# Patient Record
Sex: Female | Born: 1980 | Race: White | Hispanic: No | Marital: Single | Smoking: Never smoker
Health system: Southern US, Community
[De-identification: ages and names within clinical notes are randomized; demographics above are authoritative.]

## PROBLEM LIST (undated history)

## (undated) ENCOUNTER — Ambulatory Visit: Payer: BC Managed Care – PPO | Source: Home / Self Care

## (undated) DIAGNOSIS — H6690 Otitis media, unspecified, unspecified ear: Secondary | ICD-10-CM

## (undated) HISTORY — DX: Otitis media, unspecified, unspecified ear: H66.90

## (undated) HISTORY — PX: MYRINGOTOMY: SUR874

---

## 2005-07-10 ENCOUNTER — Emergency Department (HOSPITAL_COMMUNITY): Admission: EM | Admit: 2005-07-10 | Discharge: 2005-07-11 | Payer: Self-pay | Admitting: Emergency Medicine

## 2005-09-27 ENCOUNTER — Emergency Department (HOSPITAL_COMMUNITY): Admission: EM | Admit: 2005-09-27 | Discharge: 2005-09-27 | Payer: Self-pay | Admitting: Family Medicine

## 2006-05-29 ENCOUNTER — Emergency Department (HOSPITAL_COMMUNITY): Admission: EM | Admit: 2006-05-29 | Discharge: 2006-05-29 | Payer: Self-pay | Admitting: Emergency Medicine

## 2007-01-18 ENCOUNTER — Emergency Department (HOSPITAL_COMMUNITY): Admission: EM | Admit: 2007-01-18 | Discharge: 2007-01-18 | Payer: Self-pay | Admitting: Emergency Medicine

## 2007-02-12 ENCOUNTER — Inpatient Hospital Stay (HOSPITAL_COMMUNITY): Admission: AD | Admit: 2007-02-12 | Discharge: 2007-02-12 | Payer: Self-pay | Admitting: Obstetrics and Gynecology

## 2007-05-29 ENCOUNTER — Inpatient Hospital Stay (HOSPITAL_COMMUNITY): Admission: AD | Admit: 2007-05-29 | Discharge: 2007-05-29 | Payer: Self-pay | Admitting: Obstetrics and Gynecology

## 2007-07-01 ENCOUNTER — Inpatient Hospital Stay (HOSPITAL_COMMUNITY): Admission: AD | Admit: 2007-07-01 | Discharge: 2007-07-04 | Payer: Self-pay | Admitting: Obstetrics and Gynecology

## 2007-07-01 ENCOUNTER — Encounter (INDEPENDENT_AMBULATORY_CARE_PROVIDER_SITE_OTHER): Payer: Self-pay | Admitting: Obstetrics and Gynecology

## 2007-08-15 ENCOUNTER — Emergency Department (HOSPITAL_COMMUNITY): Admission: EM | Admit: 2007-08-15 | Discharge: 2007-08-15 | Payer: Self-pay | Admitting: Emergency Medicine

## 2008-05-13 ENCOUNTER — Emergency Department (HOSPITAL_COMMUNITY): Admission: EM | Admit: 2008-05-13 | Discharge: 2008-05-13 | Payer: Self-pay | Admitting: Emergency Medicine

## 2008-05-27 ENCOUNTER — Inpatient Hospital Stay (HOSPITAL_COMMUNITY): Admission: AD | Admit: 2008-05-27 | Discharge: 2008-05-27 | Payer: Self-pay | Admitting: Obstetrics & Gynecology

## 2008-06-21 ENCOUNTER — Inpatient Hospital Stay (HOSPITAL_COMMUNITY): Admission: AD | Admit: 2008-06-21 | Discharge: 2008-06-21 | Payer: Self-pay | Admitting: Obstetrics & Gynecology

## 2008-11-14 ENCOUNTER — Inpatient Hospital Stay (HOSPITAL_COMMUNITY): Admission: AD | Admit: 2008-11-14 | Discharge: 2008-11-14 | Payer: Self-pay | Admitting: Obstetrics & Gynecology

## 2008-12-19 ENCOUNTER — Inpatient Hospital Stay (HOSPITAL_COMMUNITY): Admission: RE | Admit: 2008-12-19 | Discharge: 2008-12-22 | Payer: Self-pay | Admitting: Obstetrics and Gynecology

## 2009-01-07 IMAGING — US US ABDOMEN COMPLETE
1 series · 14 of 25 positions shown · non-contrast
Comparison: none

CLINICAL DATA: Abdominal pain. 
ABDOMEN ULTRASOUND COMPLETE ? 08/15/07:
TECHNIQUE: Complete abdominal ultrasound examination was performed including evaluation of the liver, gallbladder, bile ducts, pancreas, kidneys, spleen, IVC, and abdominal aorta.

[Series 1: unknown · 0.30mm/px · 14 of 50 slices shown]
[im 1/50]
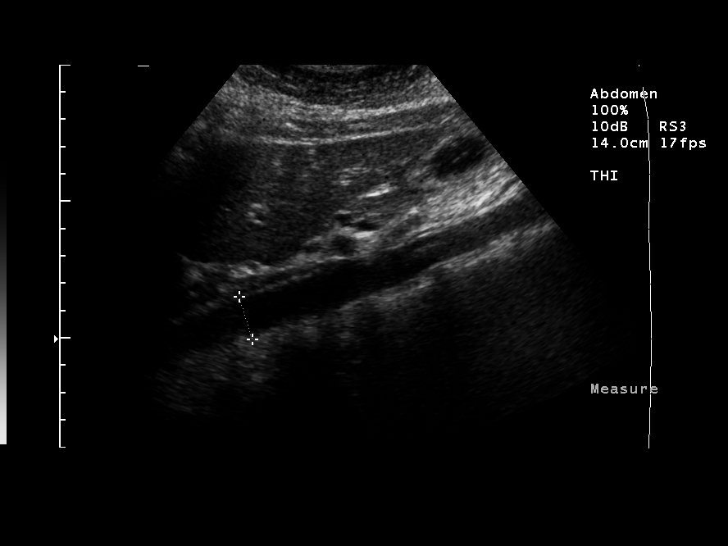
[im 5/50]
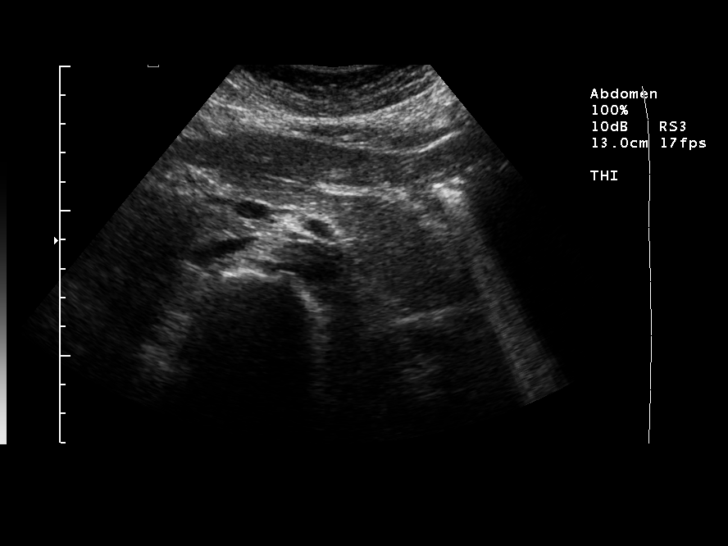
[im 9/50]
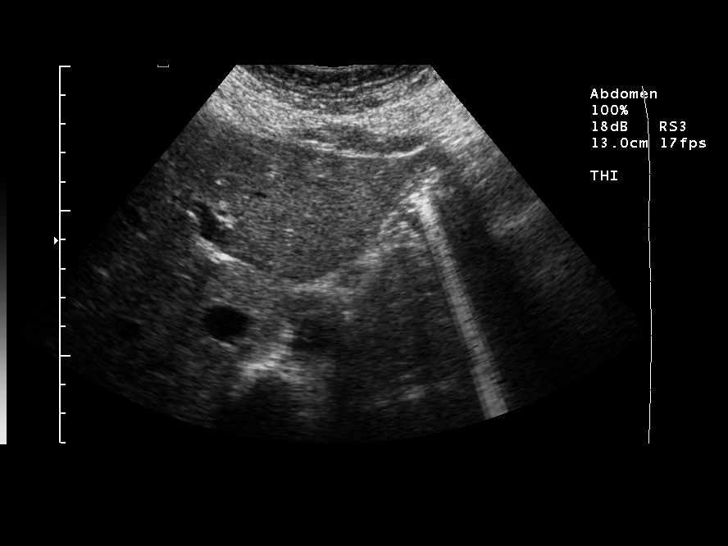
[im 13/50]
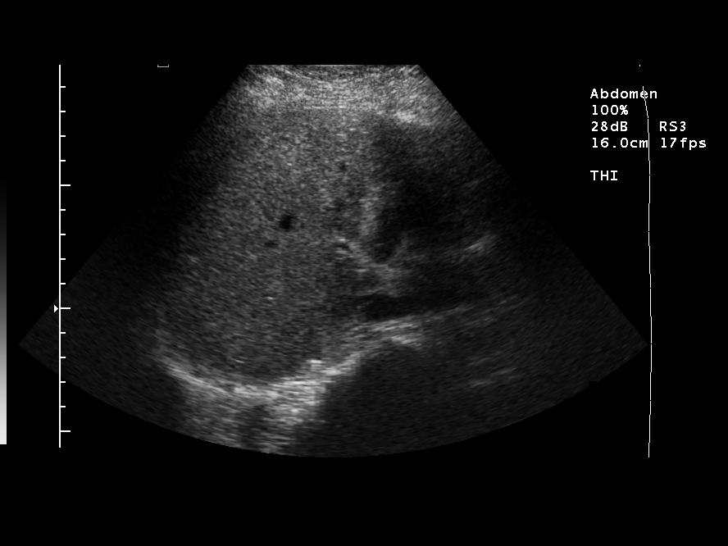
[im 17/50]
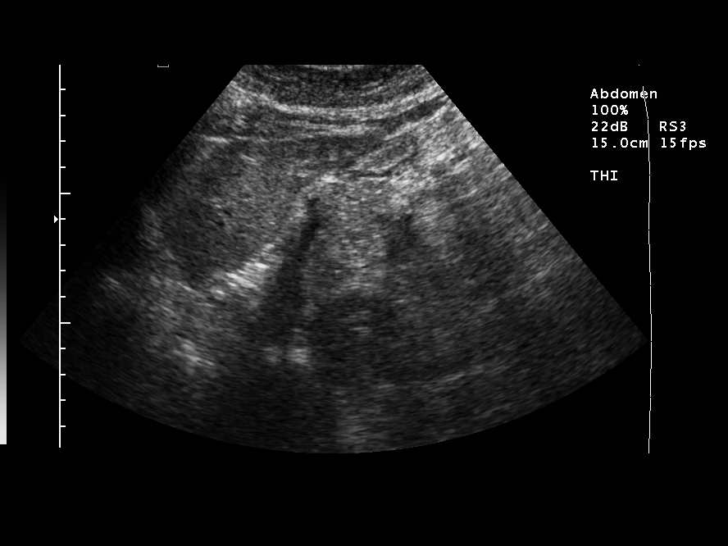
[im 19/50]
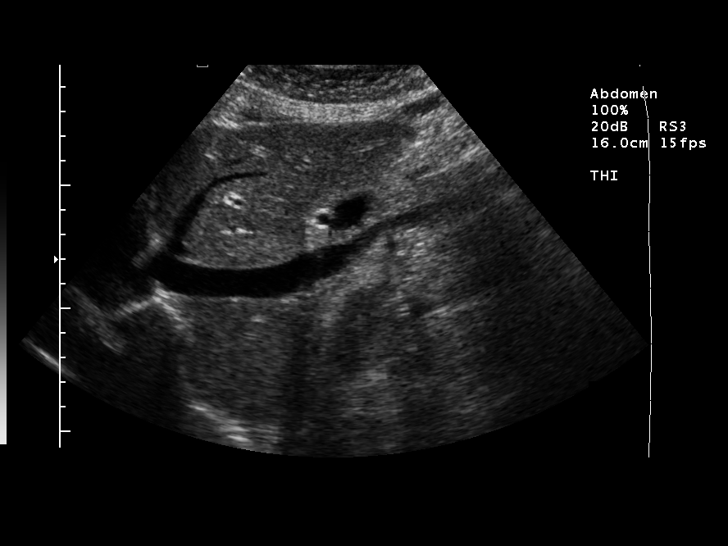
[im 23/50]
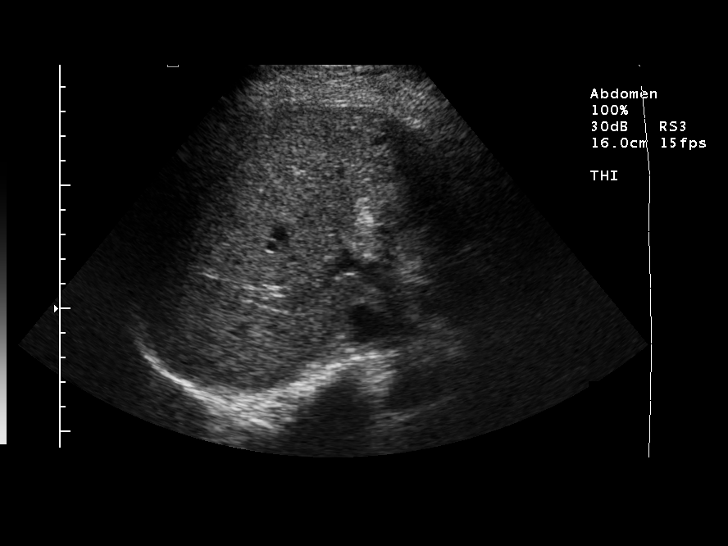
[im 27/50]
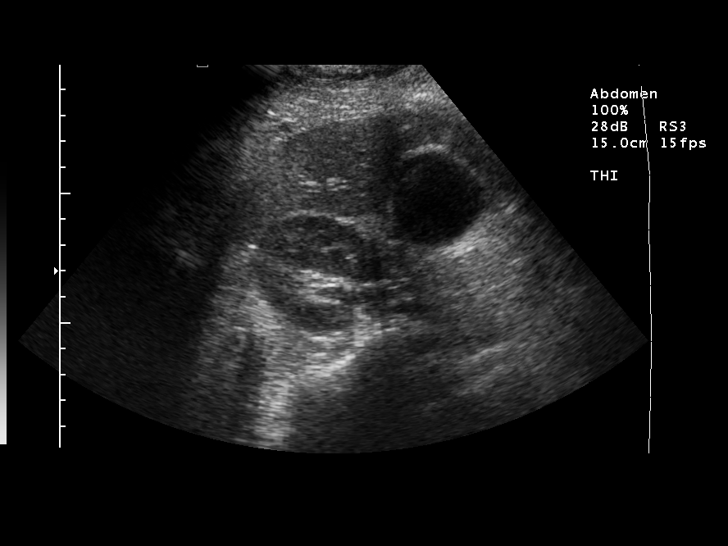
[im 31/50]
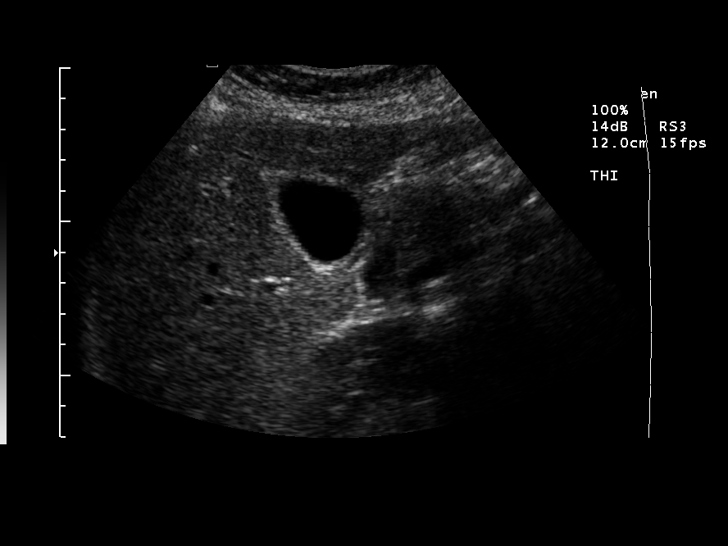
[im 33/50]
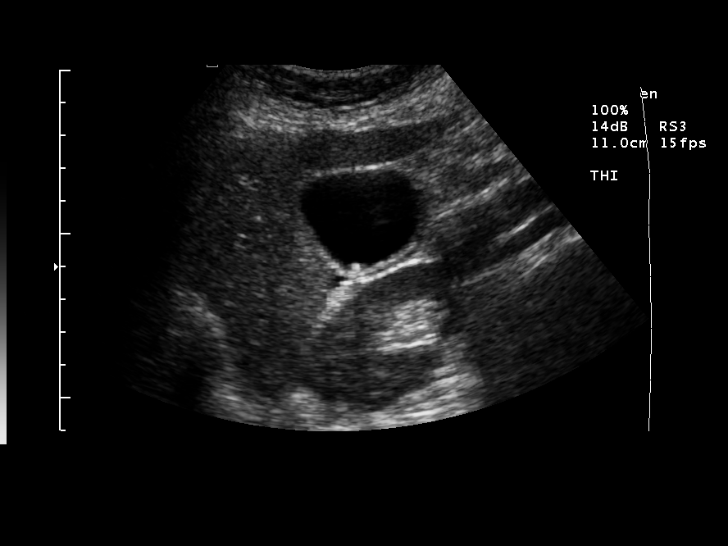
[im 37/50]
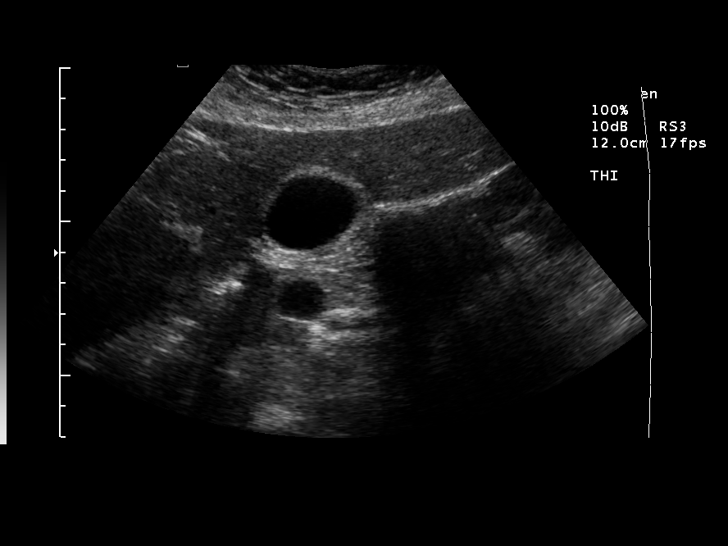
[im 41/50]
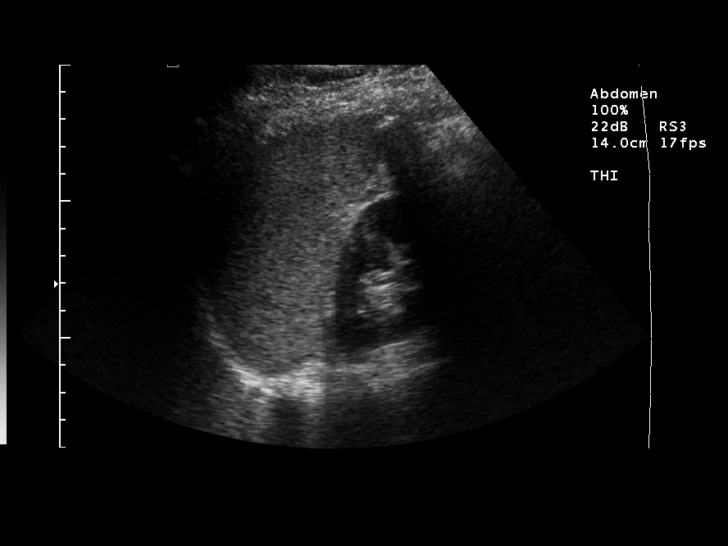
[im 45/50]
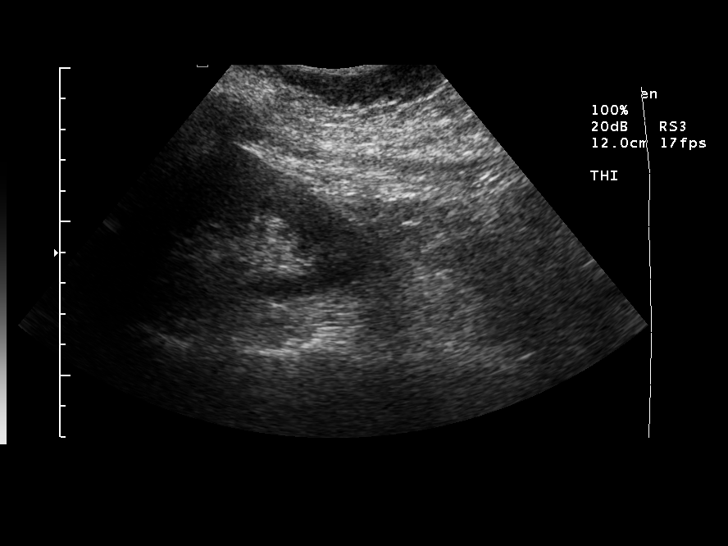
[im 50/50]
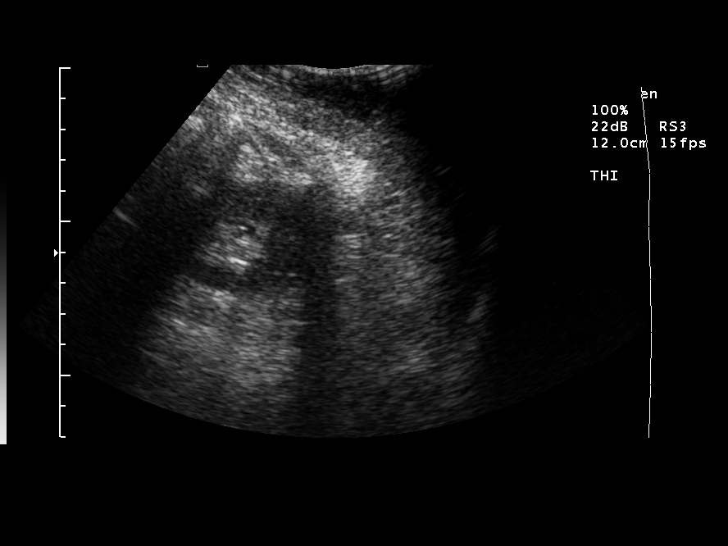

[14 of 25 positions shown; findings below may reference images not displayed]

FINDINGS: Gallstones.  Gallbladder wall thickness is 1.8 mm.  No gallbladder wall edema or free pericholecystic fluid.  The common duct measures 2.7 mm in diameter which is well within normal limits.  No hepatic, pancreatic, splenic, or renal abnormality.  The spleen measures 10.2 cm in length.  The right and left kidneys measure 10.8 cm and 10.0 cm in length, respectively.  Patent IVC.  The maximum diameter of the abdominal aorta is 1.6 cm.
IMPRESSION: Cholelithiasis.  No biliary ductal dilatation.

## 2009-10-20 IMAGING — US US OB COMP LESS 14 WK
1 series · 14 of 16 positions shown · non-contrast
Comparison: none

CLINICAL DATA: OBSTETRIC <14 WK ULTRASOUND
TECHNIQUE: Transvaginal ultrasound was performed for evaluation
of the gestation as well as the maternal uterus and adnexal
regions.

Number of gestation: 1
Heart Rate: 169 bpm
CRL:  25.7 mm         9w  2d
Maternal uterus/adnexae:
There is no evidence for subchorionic hemorrhage
The right and left ovary appear normal.
There is no free fluid.

[Series 1: us ob comp less 14 wk · 0.23mm/px · 16 acquisitions, 14 frames shown]
[im 1/16]
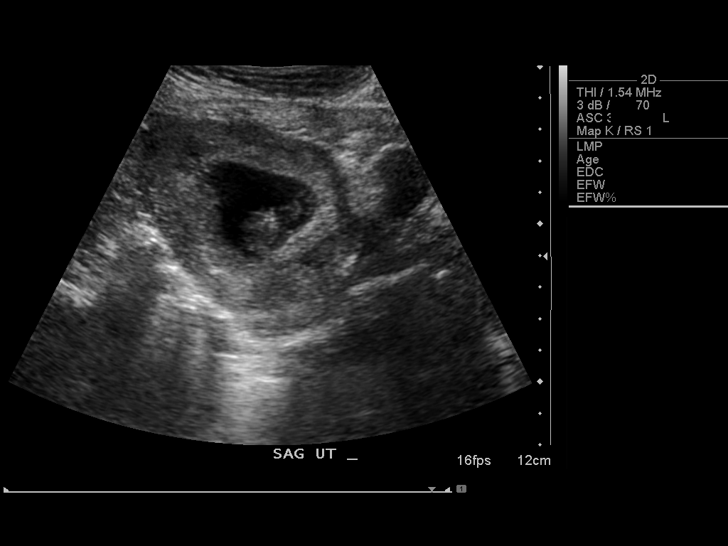
[im 2/16]
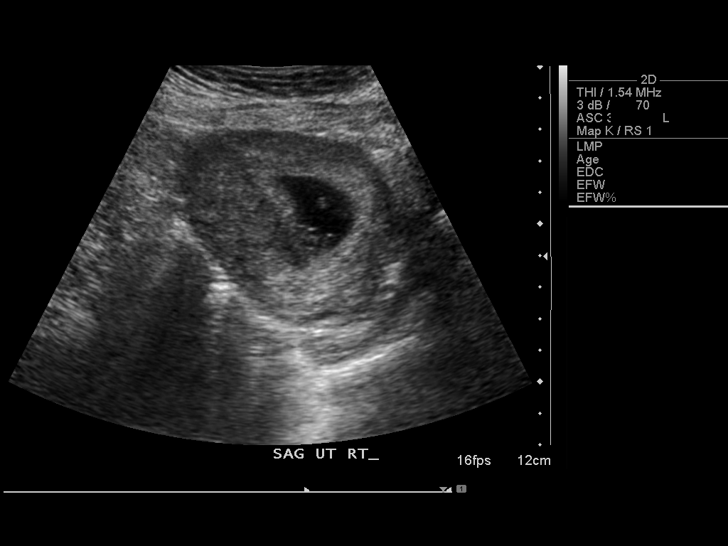
[im 3/16]
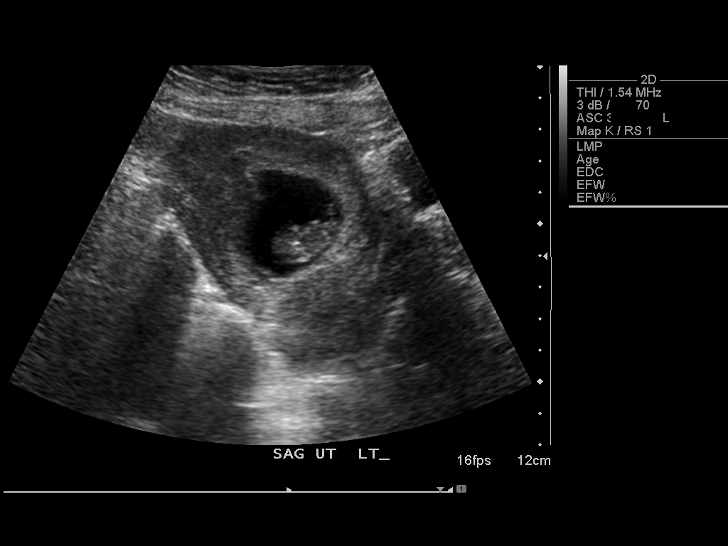
[im 5/16]
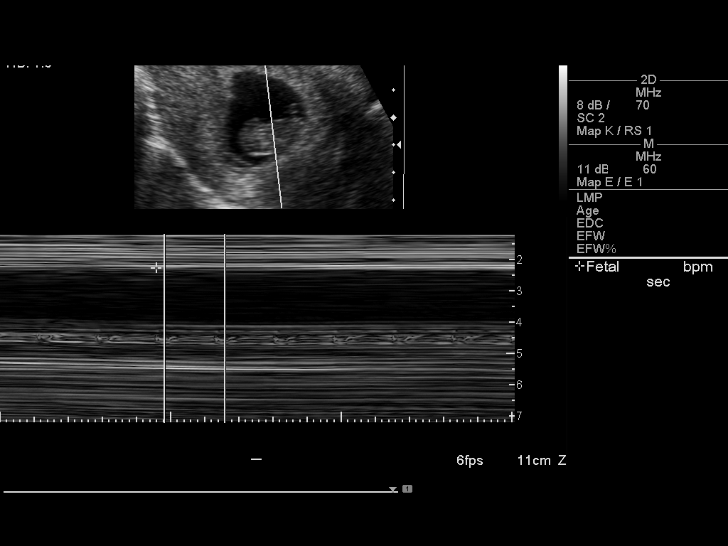
[im 6/16]
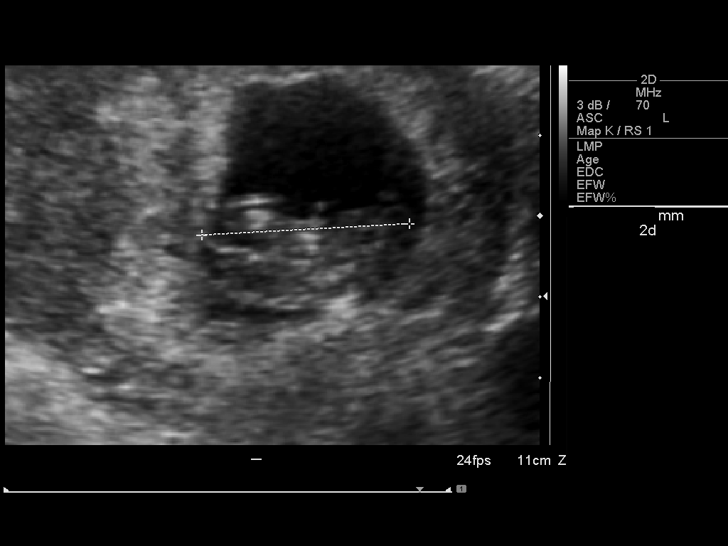
[im 7/16]
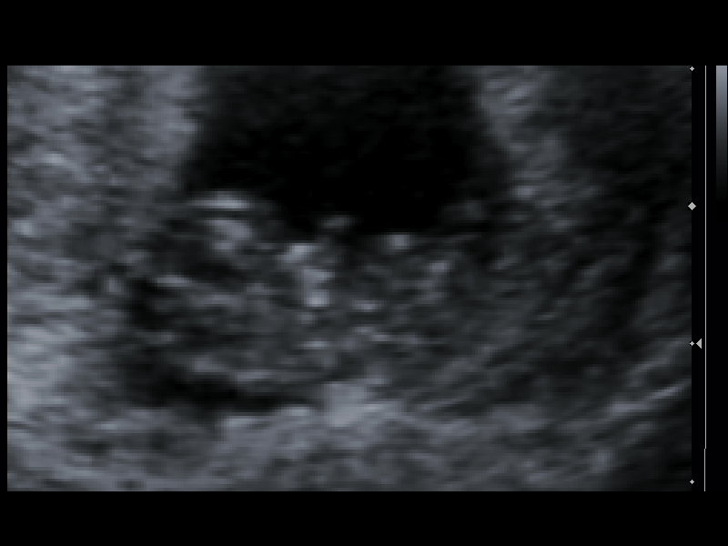
[im 8/16]
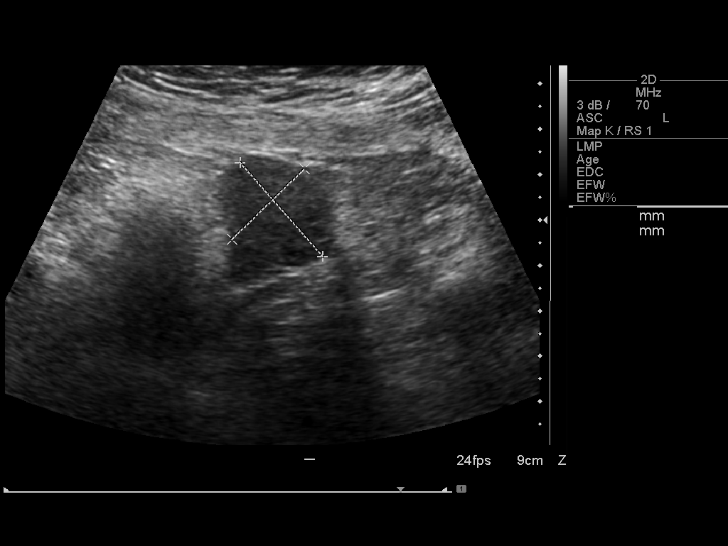
[im 9/16]
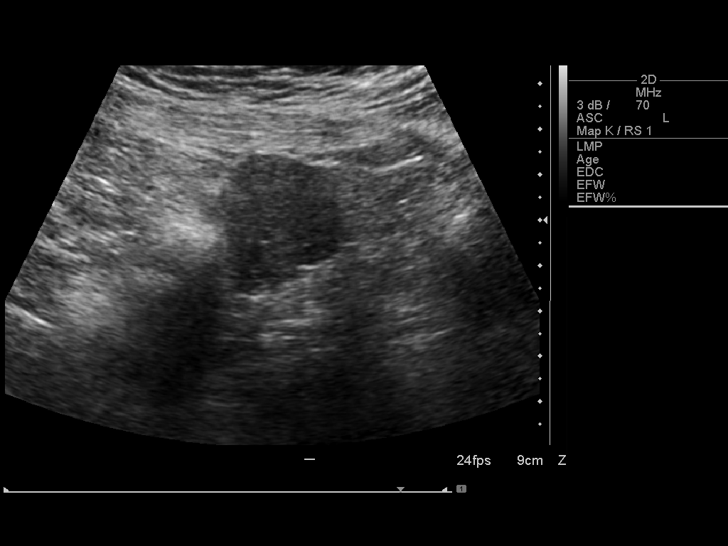
[im 10/16]
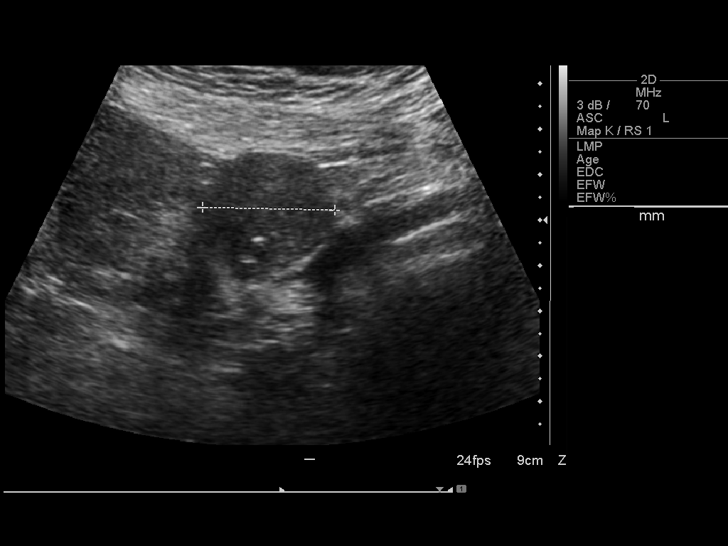
[im 11/16]
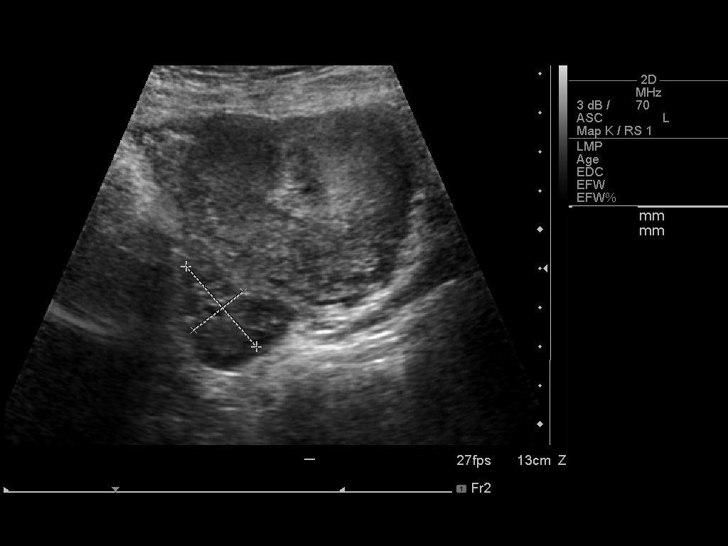
[im 13/16]
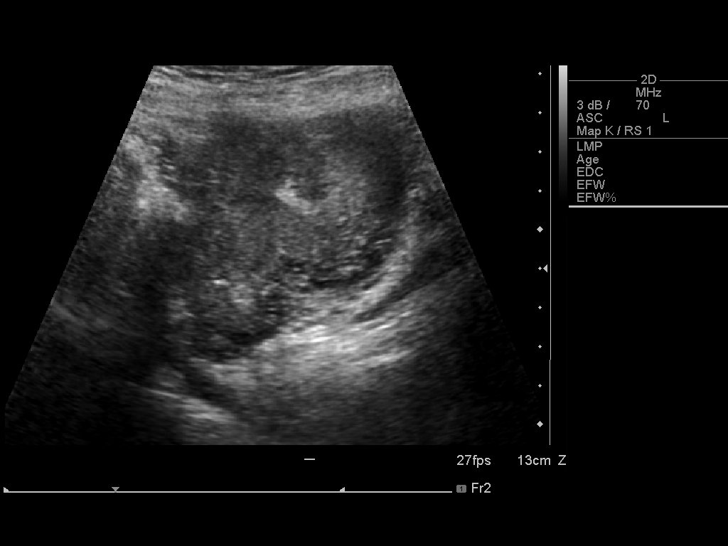
[im 14/16]
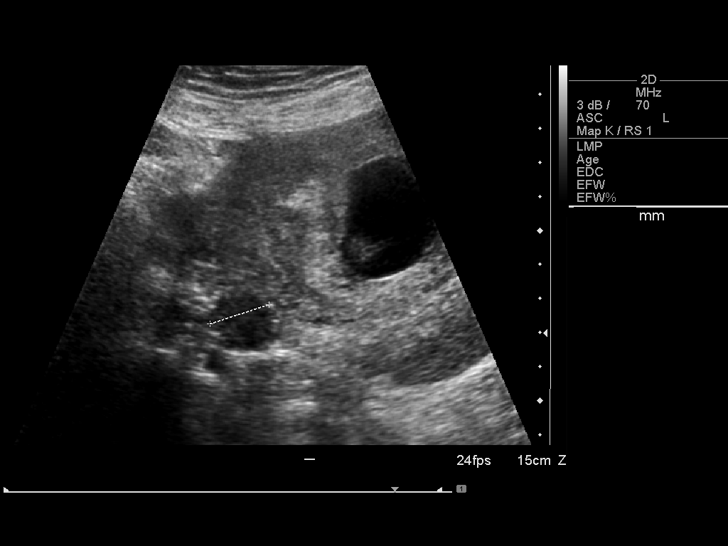
[im 15/16]
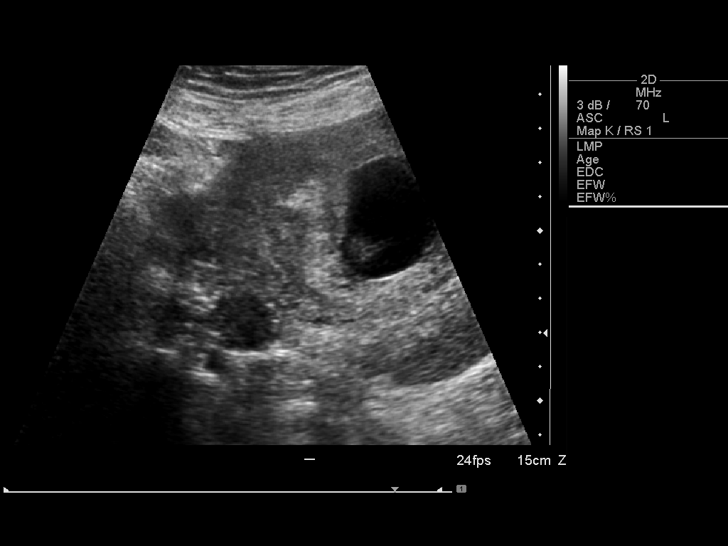
[im 16/16]
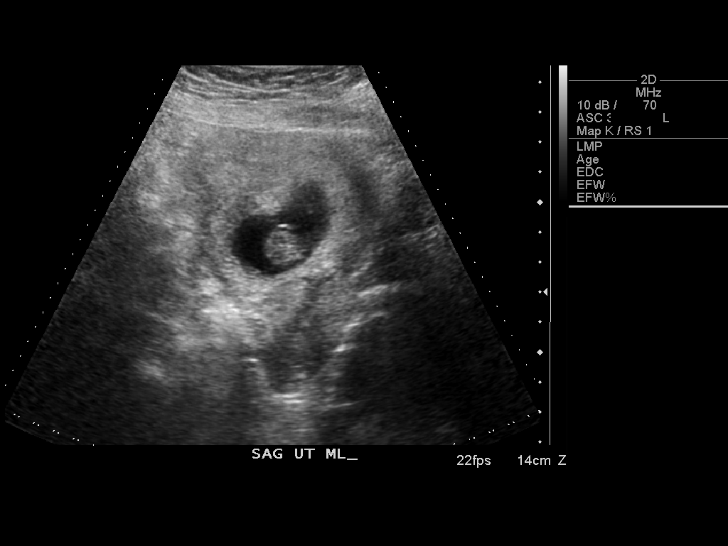

[14 of 16 positions shown; findings below may reference images not displayed]

IMPRESSION: 1.  Single living intrauterine pregnancy with an estimated
gestational age of 9 weeks and 2 days. Correlation with the
patient's last menstrual period is advised
2.  No complicating features identified.

## 2011-03-25 LAB — CBC
Hemoglobin: 9 g/dL — ABNORMAL LOW (ref 12.0–15.0)
MCHC: 32.9 g/dL (ref 30.0–36.0)
Platelets: 174 10*3/uL (ref 150–400)
Platelets: 202 10*3/uL (ref 150–400)
RDW: 18 % — ABNORMAL HIGH (ref 11.5–15.5)
WBC: 10.6 10*3/uL — ABNORMAL HIGH (ref 4.0–10.5)

## 2011-03-25 LAB — ABO/RH: ABO/RH(D): O NEG

## 2011-03-25 LAB — RH IMMUNE GLOB WKUP(>/=20WKS)(NOT WOMEN'S HOSP)

## 2011-03-25 LAB — TYPE AND SCREEN: ABO/RH(D): O NEG

## 2011-03-25 LAB — RPR: RPR Ser Ql: NONREACTIVE

## 2011-03-25 LAB — CCBB MATERNAL DONOR DRAW

## 2011-04-23 NOTE — Op Note (Signed)
Samantha Bates, Samantha Bates            ACCOUNT NO.:  [id number]   MEDICAL RECORD NO.:  [id number]          PATIENT TYPE:  INP   LOCATION:  9318                          [facility]ACILITY:  [facility]   PHYSICIAN:  Maxie Better, M.D.DATE OF BIRTH:  [birth year]   DATE OF PROCEDURE:  07/01/2007  DATE OF DISCHARGE:                               OPERATIVE REPORT   PREOPERATIVE DIAGNOSIS:  Nonreassuring fetal tracing, concealed  placental abruption.   PROCEDURES:  An emergency primary cesarean section via high transverse  uterine incision.   POSTOPERATIVE DIAGNOSIS:  Nonreassuring fetal tracing, uterine rupture.   ANESTHESIA:  Spinal.   SURGEON:  Maxie Better, M.D.   ASSISTANT:  Marlinda Mike, C.N.M.   INDICATIONS:  30 year old G1 P0 female brought by ambulance from the  office at 35-4/7 weeks due to the fetal tracing that had repetitive deep  variable decelerations and contractions every minute.  The patient was 2  cm in the office. She had not had any vaginal bleeding.  However, given  the intensity of the tracing concern for placental abruption was made,  the patient was transferred by ambulance and taken directly to the  operating room. In the operating room her fetal heart rate initially was  140 with decelerations noted with contractions. Decision was made to  proceed with primary cesarean section in the setting.  The patient was  advised of that plan.   DESCRIPTION OF PROCEDURE:  Under adequate spinal anesthesia the patient  was quickly prepped and draped.  Indwelling Foley catheter was placed.  Quarter percent Marcaine was injected along the previous Pfannenstiel  skin incision.  The Pfannenstiel skin incision was taken down to the  rectus fascia.  Rectus fascia was opened transversely.  Rectus fascia  was then bluntly sharply dissected and rectus muscle were split in  midline.  The parietal peritoneum was entered bluntly. On entering the  cavity there was copious amount of blood.   Inspection of the uterus  showed that there was a rent in the uterus in its midportion consistent  with rupture with only membranes being seen. Incision was made with a  rupture of membranes and extended through that already opened site.  Subsequent delivery of a live female was accomplished. Cord was clamped,  cut and the baby was transferred to the awaiting pediatricians who  assigned Apgars of 8 and 9 at 1 and 5 minutes.  The placenta was  manually removed.  Some evidence of old blood present.  The placenta was  sent to pathology.  Cord pH was obtained which was 7.32.  The uterus was  exteriorized.  The bladder peritoneum was still intact.  Normal tubes  and ovaries were noted bilaterally and the defect in the uterus which  had been extended was closed in two layers. The first layer of 0  Monocryl running locked stitch, second layer was imbricating using 0  Monocryl suture. Uterus was then returned to the abdomen. Abdomen was  then copiously irrigated.  Reinspection of the site showed hemostasis.  The parietal peritoneum was then closed with 2-0 Vicryl suture.  The  rectus fascia was closed with 0  Vicryl x2.  The subcutaneous areas  irrigated, small bleeders cauterized.  Interrupted 2-0 plain sutures  were then placed and skin approximated with Ethicon staples.   SPECIMENS:  Placenta sent to pathology.   ESTIMATED BLOOD LOSS:  Was 1220 mL.   URINE OUTPUT:  Was 100 mL clear urine.   INTRAOPERATIVE FLUID:  Was 2900 mL crystalloid.  Sponge and instrument  counts was correct.  Baby was transferred to NICU due to prematurity and  the patient tolerated the procedure well and was transferred to recovery  in stable condition.      Maxie Better, M.D.  Electronically Signed     Quay/MEDQ  D:  07/01/2007  T:  07/02/2007  Job:  [id number]

## 2011-04-23 NOTE — Op Note (Signed)
Samantha Bates, Samantha Bates            ACCOUNT NO.:  [id number]   MEDICAL RECORD NO.:  [id number]          PATIENT TYPE:  INP   LOCATION:  9133                          [facility]ACILITY:  [facility]   PHYSICIAN:  Kendra H. Tenny Craw, MD     DATE OF BIRTH:  1981-05-10   DATE OF PROCEDURE:  12/19/2008  DATE OF DISCHARGE:                               OPERATIVE REPORT   PREOPERATIVE DIAGNOSES:  1. A 38-week and 0-day intrauterine pregnancy.  2. History of prior uterine rupture, declined trial of labor.   POSTOPERATIVE DIAGNOSES:  1. A 38-week and 0-day intrauterine pregnancy.  2. History of prior uterine rupture, declined trial of labor.  3. Adhesive disease.   PROCEDURE:  Repeat low transverse cesarean section via Pfannenstiel skin  incision.   SURGEON:  Freddrick March. Tenny Craw, MD   ASSISTANT:  Luvenia Redden, MD   ANESTHESIA:  Spinal.   OPERATIVE FINDINGS:  Vigorous female infant in the vertex presentation,  weighing 6 pounds 14 ounces with Apgar scores of 8 at 1 minute and 9 at  5 minutes.  Normal-appearing ovaries, tubes, and uterus.  Adhesive  disease involved the lower uterine segment to the anterior peritoneum  was noted.   SPECIMEN:  Placenta for disposal.   ESTIMATED BLOOD LOSS:  800 mL.   COMPLICATIONS:  None.   DESCRIPTION OF PROCEDURE:  The patient is a 30 year old G3, P1-1-0-1 who  presents today at 21 weeks' gestational age for repeat low transverse  cesarean section.  She has a history of an emergency cesarean section at  36 weeks with her prior pregnancy for a placental abruption and partial  uterine rupture that was noted at the time of delivery.  During this  pregnancy, the patient was offered amniocentesis at 36 weeks without  right delivery with demonstration of fetal lung maturity versus outright  delivery at 38 weeks of pregnancy and the patient elected to await 38  weeks of pregnancy.  Following the appropriate informed consent, the  patient was brought to the operating room  where a spinal anesthesia was  administered and found to be adequate.  She was placed in the dorsal  supine position, a leftward tilt, prepped and draped in the normal  sterile fashion.  Scalpel was used to make a repeat Pfannenstiel skin  incision.  The patient's prior incision had keloid and this was removed  and the electrocautery unit was then used to carry down the incision  through the soft tissue to the fascia.  Fascia was incised in the  midline.  Fascial incision was extended laterally with Mayo scissors.  Superior aspect of the fascial incision was grasped with Kocher clamps  x2 and tented up.  Underlying rectus muscle was dissected off sharply  with the electrocautery unit.  The same procedure was repeated on the  inferior aspect of the fascial incision.  The rectus muscles were then  separated in the midline.  The abdominal peritoneum was noted to have  been entered and the rectus muscles were separated with the  electrocautery unit.  At that time of entry into the abdominal cavity,  significant adhesive  disease between the lower uterine segment and the  anterior peritoneum was noted and these adhesions were taken down.  At  this time, good visualization of the lower uterine segment was achieved.  A scalpel was used to make a low transverse incision on the uterus,  which was extended laterally with blunt dissection.  Amniotomy was  performed for clear fluid.  The fetal vertex was identified and  delivered atraumatically through the uterine incision followed by the  body.  The infant was bulb suctioned.  Cord was clamped and cut.  The  infant was passed to the waiting pediatricians.  Placenta was then  delivered spontaneously.  Uterus was exteriorized, cleared of all clot  and debris.  Uterine incision was then repaired with #1 chromic in a  running locked fashion with a second imbricating layer.  Hemostasis was  achieved.  Ovaries, tubes, and uterus were noted to be normal.   No  evidence of lower uterine segment defect was noted.  The uterus was then  returned to the abdominal cavity.  Abdominal cavity was cleared of all  clot and debris.  The uterus was reinspected for hemostasis and this was  noted.  The abdominal peritoneum was then reapproximated with 2-0  Vicryl.  The muscles were then reapproximated with #1 chromic in a  running fashion.  The fascia was closed with a looped PDS in a running  fashion.  The skin was closed with the staples.  All sponge, lap, and  needle counts were correct x2.  The patient tolerated the procedure well  and was sent to the recovery room in stable condition following the  procedure.      Freddrick March. Tenny Craw, MD  Electronically Signed     KHR/MEDQ  D:  12/19/2008  T:  12/20/2008  Job:  [id number]

## 2011-04-23 NOTE — H&P (Signed)
[name], [name]            ACCOUNT NO.:  [id number]   MEDICAL RECORD NO.:  [id number]          PATIENT TYPE:  AMB   LOCATION:  SDC                           [facility]   PHYSICIAN:  Kendra H. Tenny Craw, MD     DATE OF BIRTH:  [birth year]   DATE OF ADMISSION:  12/19/2008  DATE OF DISCHARGE:                              HISTORY & PHYSICAL   DATE OF PROCEDURE:  December 19, 2008.   CHIEF COMPLAINT:  Scheduled repeat cesarean section.   HISTORY OF PRESENT ILLNESS:  [name] is a 30 year old gravida 3 para  2 who presents at 21 weeks and 3 days estimated gestational age, with an  estimated date of confinement of December 29, 2008, for a scheduled  repeat cesarean section.  She has a history of a prior emergency  cesarean section at [redacted] weeks gestational age with a pregnancy in 2008  for which there was a question of uterine rupture.  The patient was  offered amniocentesis for fetal lung maturity with cesarean section to  follow at 36 weeks if fetal lung maturity could be demonstrated versus  outright delivery at [redacted] weeks gestational age, and she elected to wait  until 38 weeks of pregnancy.  Risks, benefits and alternatives of  delivery at 38 weeks, including the possibility of fetal lung  immaturity, were discussed with the patient and she presents today for  scheduled repeat cesarean section on December 19, 2008.   PAST MEDICAL HISTORY:  Significant for:  1. Pyelonephritis in her first pregnancy.  2. Rh negative.  3. Late prenatal care.   PAST SURGICAL HISTORY:  Emergency cesarean section in July 2008 for a  placental abruption and possible uterine rupture.   PAST OBSTETRICAL HISTORY:  1. Gravida 3 para 1-1-0-2.  2. In November 2004 she had a spontaneous vaginal delivery of a female      infant weighing 6 pounds 15 ounces at 39 weeks.  3. As stated above, in July 2008 she underwent an emergency low      transverse cesarean section for placental abruption and possible  uterine rupture.  In reviewing the operative report from 2008, upon      entry of the abdominal cavity hemoperitoneum was noted in addition      to a small defect in the mid anterior wall of the uterus.  Upon      entering the uterine cavity a partial placental abruption was      noted.   PAST GYN HISTORY:  1. No abnormal Pap smears.  2. Remote history of Chlamydia.   MEDICATIONS:  Prenatal vitamins.   ALLERGIES:  No known drug allergies.   PRENATAL LABS:  A negative, Rh antibody negative, RPR nonreactive,  rubella immune, hepatitis B surface antigen negative, HIV nonreactive.  Maternal quad screen was within normal limits.  A 1-hour Glucola was  142, a 3-hour Glucola was within normal limits.  Group B strep negative.  Gonorrhea and Chlamydia negative.  Urine culture negative.  Pap smear  within normal limits.   PHYSICAL EXAM:  Weight is 156, height 5 feet 2 inches, blood  pressure  100/60.  Alert and oriented x3.  ABDOMEN:  Gravid, soft, nontender, nondistended.  Fetal heart tones are  140s by Doppler.  Presenting part is vertex.  CERVICAL EXAM:  Is 2-3 cm dilated, 50% effaced, -2 station.   ASSESSMENT/PLAN:  This is a 30 year old gravida 3, para 1-1-0-2, at 30  weeks and 0 estimated gestational age, for repeat cesarean section.  Will plan to have cefoxitin 2 grams administered prior to skin incision.      Freddrick March. Tenny Craw, MD  Electronically Signed     KHR/MEDQ  D:  12/18/2008  T:  12/18/2008  Job:  [id number]

## 2011-04-26 NOTE — Discharge Summary (Signed)
Samantha Bates            ACCOUNT NO.:  [id number]   MEDICAL RECORD NO.:  [id number]          PATIENT TYPE:  INP   LOCATION:  9318                          [facility]ACILITY:  [facility]   PHYSICIAN:  Samantha Bates, M.D.DATE OF BIRTH:  1981/06/24   DATE OF ADMISSION:  07/01/2007  DATE OF DISCHARGE:  07/04/2007                               DISCHARGE SUMMARY   ADMISSION DIAGNOSES:  1. Nonreassuring fetal tracing.  2. Intrauterine gestation at 35+ weeks.  3. Concealed abruption   DISCHARGE DIAGNOSES:  1. Nonreassuring fetal tracing.  2. Uterine rupture.  3. Intrauterine gestation at 35+ weeks, delivered.  4. Iron-deficiency anemia.   PROCEDURE:  Primary emergency cesarean section, high transverse.   HISTORY OF PRESENT ILLNESS:  The patient is a 30 year old gravida 2,  para 1-0-0-1 female at 35+ weeks gestation.  Presented to the office and  was placed on a monitor which showed multiple deep variable  decelerations and contractions every 2 minutes with a nonreassuring  tracing and was  transferred to the hospital via ambulance and taken to  the OR due to the finding of the tracing.  Her cervix was 2, 70, and  minus 2 in the office  per the C.N.M. exam.   HOSPITAL COURSE:  The patient was taken to that to the operating room.  She underwent an emergency primary cesarean section.  Complete abruption  was the working diagnosis.  She had a uterine rupture on entering the  abdomen.  The procedure resulted in delivery of a live female. Cord pH  of 7.32.  Apgars 8 and 9.  Normal tubes and ovaries were noted.  Weight  of baby was 5 pounds.  The patient subsequently had an uncomplicated  post operative course.  Her CBC on postop day #1 showed a hemoglobin  9.5, hematocrit 26.7, white count 9,  platelet count of 155,000.  She  started with a hemoglobin of 9.7.  The baby went to the NICU due to its  prematurity and the patient was advised that she is not a candidate for  vaginal delivery in  the future.  Her placenta showed a premature  placenta with three-vessel cord.  The patient who was Rh negative did  not receive RhoGAM due to the baby also Rh negative.  By postop day #3  the patient was doing well.  The baby also was doing well in the NICU.  Her incision had no evidence of infection.  She was deemed well to be  discharged home.   DISPOSITION:  Home.   CONDITION ON DISCHARGE:  Stable.   DISCHARGE MEDICATIONS:  1. Niferex one p.o. daily.  2. Prenatal vitamins one p.o. daily.  3. Percocet 1-2 tablets every 6 hours p.r.n. pain.  4. Motrin 800 mg every 8 hours p.r.n. pain.   FOLLOW UP:  Followup appointment at [facility] in 6 weeks and  discharge instructions per the postpartum booklet given.      Samantha Bates, M.D.  Electronically Signed     /MEDQ  D:  08/16/2007  T:  08/17/2007  Job:  [id number]

## 2011-04-26 NOTE — Discharge Summary (Signed)
Samantha Bates, Samantha Bates            ACCOUNT NO.:  [id number]   MEDICAL RECORD NO.:  [id number]          PATIENT TYPE:  INP   LOCATION:  9133                          [facility]ACILITY[facility]   PHYSICIAN:  Kendra H. Tenny Craw, MD     DATE OF BIRTH:  11/15/1981   DATE OF ADMISSION:  12/19/2008  DATE OF DISCHARGE:  12/22/2008                               DISCHARGE SUMMARY   FINAL DIAGNOSES:  Intrauterine gestation at 38 weeks, history of prior  uterine rupture, and adhesive disease.   PROCEDURE:  Repeat low-transverse cesarean section.   SURGEON:  Freddrick March. Tenny Craw, MD.   ASSISTANT:  Luvenia Redden, MD.   COMPLICATIONS:  None.   This 30 year old G3, P 1-1-0-1 presents at 73 weeks' gestation for  repeat cesarean section.  The patient had a history of a emergency  cesarean section at 36 weeks with her last pregnancy secondary to  placental abruption and partial uterine rupture.  The patient was  offered amniocentesis at 36 weeks to demonstrate lung maturity versus  delivery of 38, which the patient desired to proceed with.  Otherwise,  the patient's antepartum course up to this point had been complicated by  late prenatal care.  The patient did not present to [place] to close to [redacted]  weeks gestation.  She did have a quad screen performed at that time,  which did return normal.  The patient did receive RhoGAM per protocol at  28 weeks.  Otherwise, her antepartum course had been uncomplicated.  She  was taken to the operating room on December 19, 2008, by Dr. Waynard Reeds  for repeat low-transverse cesarean section, was performed with delivery  of a 6 pounds 14 ounces female infant with Apgars of 8 and 9.  Delivery  went without complications.  There was some adhesive disease noted in  the lower uterine segment, which was connected to the anterior  peritoneum.  Procedure went without complications.  The patient's  postoperative course was uncomplicated.  There was some question about  the baby with Down  syndrome even though the quad screen returned normal.  Chromosomes were obtained on the baby were not back yet by the time of  the patient's discharge.  The patient was felt ready for discharge on  postoperative day #3.  She was sent home on a regular diet, told to  decrease activities, told to continue her vitamins and her iron  supplement as directed, was given Percocet one to two every 4-6 hours as  needed for pain, told she could use Motrin up to 600 mg every 6 hours as  needed for pain, was to follow up in our office in 4-6 weeks.  Instructions and precautions were reviewed with the patient.   LABS ON DISCHARGE:  She had a hemoglobin of 9.0, white blood cell count  of 10.6, and platelets of 174,000.   The patient also received work program per protocol before discharge.      Leilani Able, P.A.-C.      Freddrick March. Tenny Craw, MD  Electronically Signed    MB/MEDQ  D:  01/26/2009  T:  01/27/2009  Job:  [phone]

## 2011-04-26 NOTE — Consult Note (Signed)
Samantha Bates, Samantha Bates            ACCOUNT NO.:  [id number]   MEDICAL RECORD NO.:  [id number]          PATIENT TYPE:  MAT   LOCATION:  MATC                          FACILITY:  [facility]   PHYSICIAN:  Lenoard Aden, M.D.DATE OF BIRTH:  [birth year]   DATE OF CONSULTATION:  02/12/2007  DATE OF DISCHARGE:                                 CONSULTATION   CHIEF COMPLAINT:  Bleeding.   She is a 30 year old female, G2, P1, at 96 weeks' gestation with the  passage of a blood clot this evening.  She has no cramping.  No bleeding  at this time.   SHE HAS NO KNOWN DRUG ALLERGIES.   MEDICATIONS:  Prenatal vitamins.   She has a history of:  1. Chlamydia.  2. Condyloma.  3. Ear surgery.   She is a nonsmoker, nondrinker.  Denies domestic or physical violence.   FAMILY HISTORY:  Hypertension, myocardial infarction, uterine and colon  cancers, congenital anomalies and encephali.   PREVIOUS OBSTETRIC HISTORY:  Spontaneous vaginal delivery, 6 pounds 7  ounces fetus at 39 weeks without complications.   PHYSICAL EXAMINATION:  VITAL SIGNS:  Stable.  She is afebrile.  HEENT:  Normal.  LUNGS:  Clear.  HEART:  Regular rhythm.  ABDOMEN:  Soft, nontender.  Gravida to 15 weeks.  Fetal heart sounds  auscultated.  No CVA tenderness.  SKIN:  Intact.  NEUROLOGIC:  Intact.  NECK:  Supple with a full range of motion.  PELVIC:  The cervix is closed, long.  Presenting part out of the pelvis.  No bleeding noted.  No blood noted.  No vaginal bulge.  EXTREMITIES:  Reveal no cords.  NEUROLOGIC:  Nonfocal.   IMPRESSION:  1. A 15-week obstetrical patient.  2. Unspecified second trimester bleeding, no active bleeding noted.      Fetal heart sounds noted.  No cervical change.   PLAN:  1. Discharge home, reassurance given.  2. Follow up in the office as scheduled.  3. Bleeding precautions given.      Lenoard Aden, M.D.  Electronically Signed     RJT/MEDQ  D:  02/12/2007  T:  02/12/2007  Job:   [id number]

## 2011-09-05 LAB — URINALYSIS, ROUTINE W REFLEX MICROSCOPIC
Glucose, UA: NEGATIVE
Glucose, UA: NEGATIVE
Glucose, UA: NEGATIVE
Hgb urine dipstick: NEGATIVE
Ketones, ur: NEGATIVE
Ketones, ur: 40 — AB
Leukocytes, UA: NEGATIVE
Protein, ur: 30 — AB
Specific Gravity, Urine: 1.042 — ABNORMAL HIGH
Specific Gravity, Urine: >1.03 — ABNORMAL HIGH
Specific Gravity, Urine: 1.025
Urobilinogen, UA: 1
Urobilinogen, UA: 1
Urobilinogen, UA: 1
pH: 6.5

## 2011-09-05 LAB — WET PREP, GENITAL
Clue Cells Wet Prep HPF POC: NONE SEEN
Trich, Wet Prep: NONE SEEN

## 2011-09-05 LAB — CBC
Platelets: 253
RBC: 4.66

## 2011-09-05 LAB — URINE MICROSCOPIC-ADD ON

## 2011-09-05 LAB — POCT PREGNANCY, URINE: Operator id: 234501

## 2011-09-13 LAB — URINALYSIS, ROUTINE W REFLEX MICROSCOPIC: Protein, ur: NEGATIVE mg/dL

## 2011-09-13 LAB — WET PREP, GENITAL
Clue Cells Wet Prep HPF POC: NONE SEEN
Trich, Wet Prep: NONE SEEN
Yeast Wet Prep HPF POC: NONE SEEN

## 2011-09-20 LAB — URINALYSIS, ROUTINE W REFLEX MICROSCOPIC
Hgb urine dipstick: NEGATIVE
Ketones, ur: NEGATIVE
Urobilinogen, UA: 0.2

## 2011-09-20 LAB — CBC
HCT: 40.5
Hemoglobin: 13.6
MCV: 84
Platelets: 245
RBC: 4.81
WBC: 10.4

## 2011-09-20 LAB — COMPREHENSIVE METABOLIC PANEL
Albumin: 3.8
Alkaline Phosphatase: 99
BUN: 9
CO2: 27
Chloride: 103
GFR calc non Af Amer: >60
Potassium: 4.5
Total Bilirubin: 1.3 — ABNORMAL HIGH

## 2011-09-20 LAB — DIFFERENTIAL
Basophils Absolute: 0
Basophils Relative: 0
Eosinophils Relative: 0
Monocytes Absolute: 0.8 — ABNORMAL HIGH
Neutro Abs: 8.2 — ABNORMAL HIGH

## 2011-09-20 LAB — LIPASE, BLOOD: Lipase: 16

## 2011-09-23 LAB — CBC
HCT: 26.7 — ABNORMAL LOW
Hemoglobin: 9.5 — ABNORMAL LOW
MCHC: 34
Platelets: 169
RBC: 3.35 — ABNORMAL LOW
RBC: 3.51 — ABNORMAL LOW
RDW: 14.4 — ABNORMAL HIGH
WBC: 13.5 — ABNORMAL HIGH
WBC: 9.9

## 2011-09-23 LAB — RPR: RPR Ser Ql: NONREACTIVE

## 2011-09-25 LAB — URINALYSIS, ROUTINE W REFLEX MICROSCOPIC
Glucose, UA: NEGATIVE
Hgb urine dipstick: NEGATIVE
Ketones, ur: 15 — AB
Nitrite: NEGATIVE
Protein, ur: NEGATIVE
Specific Gravity, Urine: >1.03 — ABNORMAL HIGH
Urobilinogen, UA: 0.2
pH: 6

## 2011-09-25 LAB — CBC
HCT: 33.2 — ABNORMAL LOW
Hemoglobin: 11.4 — ABNORMAL LOW
MCHC: 34.5
MCV: 88.4
Platelets: 237
RBC: 3.76 — ABNORMAL LOW
RDW: 13.2
WBC: 11.1 — ABNORMAL HIGH

## 2011-09-25 LAB — COMPREHENSIVE METABOLIC PANEL
ALT: 11
AST: 15
Albumin: 2.6 — ABNORMAL LOW
Alkaline Phosphatase: 94
BUN: 7
CO2: 23
Calcium: 8.6
Chloride: 106
Creatinine, Ser: 0.43
GFR calc Af Amer: >60
GFR calc non Af Amer: >60
Glucose, Bld: 135 — ABNORMAL HIGH
Potassium: 3.4 — ABNORMAL LOW
Sodium: 136
Total Bilirubin: 0.5
Total Protein: 6.4

## 2011-09-25 LAB — URINE MICROSCOPIC-ADD ON

## 2011-09-25 LAB — LACTATE DEHYDROGENASE: LDH: 126

## 2011-09-25 LAB — URIC ACID: Uric Acid, Serum: 3.6

## 2012-01-10 ENCOUNTER — Emergency Department (INDEPENDENT_AMBULATORY_CARE_PROVIDER_SITE_OTHER)
Admission: EM | Admit: 2012-01-10 | Discharge: 2012-01-10 | Disposition: A | Discharge status: Home / Self Care | Payer: Self-pay | Source: Home / Self Care | Attending: Family Medicine | Admitting: Family Medicine

## 2012-01-10 ENCOUNTER — Encounter (HOSPITAL_COMMUNITY): Payer: Self-pay

## 2012-01-10 DIAGNOSIS — J329 Chronic sinusitis, unspecified: Secondary | ICD-10-CM

## 2012-01-10 MED ORDER — AMOXICILLIN 500 MG PO CAPS: 500.0000 mg | ORAL_CAPSULE | Freq: Three times a day (TID) | ORAL | Status: AC

## 2012-01-10 NOTE — ED Provider Notes (Addendum)
History     CSN: [id number]  Arrival date & time 01/10/12  1005   First MD Initiated Contact with Patient 01/10/12 1059      Chief Complaint  Patient presents with  . Sinusitis    (Consider location/radiation/quality/duration/timing/severity/associated sxs/prior treatment) HPI Comments: Samantha Bates presents for evaluation of sinus pressure and pain, purulent nasal drainage, and now LEFT ear pain. She denies cough or fever and denies any hx of seasonal allergies.   Patient is a 31 y.o. female presenting with sinusitis. The history is provided by the patient.  Sinusitis  This is a new problem. The current episode started more than 2 days ago. The problem has not changed since onset.There has been no fever. The patient is experiencing no pain. Associated symptoms include congestion, ear pain and sinus pressure. Pertinent negatives include no chills and no cough.    Past Medical History  Diagnosis Date  . H/O myringotomy     History reviewed. No pertinent past surgical history.  History reviewed. No pertinent family history.  History  Substance Use Topics  . Smoking status: Never Smoker   . Smokeless tobacco: Not on file  . Alcohol Use: No    OB History    Grav Para Term Preterm Abortions TAB SAB Ect Mult Living                  Review of Systems  Constitutional: Negative.  Negative for fever and chills.  HENT: Positive for ear pain, congestion and sinus pressure.   Eyes: Negative.   Respiratory: Negative.  Negative for cough.   Cardiovascular: Negative.   Gastrointestinal: Negative.   Genitourinary: Negative.   Musculoskeletal: Negative.   Skin: Negative.   Neurological: Negative.     Allergies  Review of patient's allergies indicates no known allergies.  Home Medications   Current Outpatient Rx  Name Route Sig Dispense Refill  . AMOXICILLIN 500 MG PO CAPS Oral Take 1 capsule (500 mg total) by mouth 3 (three) times daily. 30 capsule 0    BP 144/80  Pulse  92  Temp(Src) 98.3 F (36.8 C) (Oral)  Resp 22  SpO2 100%  Physical Exam  Nursing note and vitals reviewed. Constitutional: She is oriented to person, place, and time. She appears well-developed and well-nourished.  HENT:  Head: Normocephalic and atraumatic.  Right Ear: Tympanic membrane normal.  Left Ear: Tympanic membrane is erythematous. Tympanic membrane is not bulging.  Mouth/Throat: Uvula is midline, oropharynx is clear and moist and mucous membranes are normal.  Eyes: EOM are normal.  Neck: Normal range of motion.  Cardiovascular: Normal rate and regular rhythm.   Pulmonary/Chest: Effort normal. She has no wheezes. She has no rhonchi.  Musculoskeletal: Normal range of motion.  Neurological: She is alert and oriented to person, place, and time.  Skin: Skin is warm and dry.  Psychiatric: Her behavior is normal.    ED Course  Procedures (including critical care time)  Labs Reviewed - No data to display No results found.   1. Sinusitis       MDM  rx given for amoxicillin; supportive care        Richardo Priest, MD 01/10/12 1129  Richardo Priest, MD 01/10/12 1130

## 2012-01-10 NOTE — ED Notes (Signed)
2 week duration  Of pain /pressure in frontal area w green secretions ; now has ear ache and decreased hearing left ear; minimal relief w OTC medications

## 2014-04-12 ENCOUNTER — Emergency Department (INDEPENDENT_AMBULATORY_CARE_PROVIDER_SITE_OTHER)
Admission: EM | Admit: 2014-04-12 | Discharge: 2014-04-12 | Disposition: A | Discharge status: Home / Self Care | Payer: Self-pay | Source: Home / Self Care | Attending: Family Medicine | Admitting: Family Medicine

## 2014-04-12 ENCOUNTER — Encounter (HOSPITAL_COMMUNITY): Payer: Self-pay | Admitting: Emergency Medicine

## 2014-04-12 DIAGNOSIS — J302 Other seasonal allergic rhinitis: Secondary | ICD-10-CM

## 2014-04-12 DIAGNOSIS — J309 Allergic rhinitis, unspecified: Secondary | ICD-10-CM

## 2014-04-12 MED ORDER — IPRATROPIUM BROMIDE 0.06 % NA SOLN: 2.0000 | Freq: Four times a day (QID) | NASAL | Status: DC

## 2014-04-12 MED ORDER — METHYLPREDNISOLONE ACETATE 40 MG/ML IJ SUSP
80.0000 mg | Freq: Once | INTRAMUSCULAR | Status: AC
  Administered 2014-04-12: 80 mg via INTRAMUSCULAR

## 2014-04-12 MED ORDER — CETIRIZINE HCL 10 MG PO TABS: 10.0000 mg | ORAL_TABLET | Freq: Every day | ORAL | Status: DC

## 2014-04-12 MED ORDER — TRIAMCINOLONE ACETONIDE 40 MG/ML IJ SUSP
INTRAMUSCULAR | Status: AC
  Filled 2014-04-12: qty 1

## 2014-04-12 MED ORDER — TRIAMCINOLONE ACETONIDE 40 MG/ML IJ SUSP
40.0000 mg | Freq: Once | INTRAMUSCULAR | Status: AC
  Administered 2014-04-12: 40 mg via INTRAMUSCULAR

## 2014-04-12 MED ORDER — METHYLPREDNISOLONE ACETATE 80 MG/ML IJ SUSP
INTRAMUSCULAR | Status: AC
  Filled 2014-04-12: qty 1

## 2014-04-12 NOTE — ED Notes (Signed)
Patient has had issues with ears being stuffy or ringing over the past few weeks, but most recently left ear is stuffy, feels "full".  Patient has also had runny nose and throat drainage.  Patient also complains of right side of face hurting.

## 2014-04-12 NOTE — ED Provider Notes (Signed)
CSN: [id number]     Arrival date & time 04/12/14  1211 History   First MD Initiated Contact with Patient 04/12/14 1410     Chief Complaint  Patient presents with  . Headache   (Consider location/radiation/quality/duration/timing/severity/associated sxs/prior Treatment) Patient is a 33 y.o. female presenting with URI. The history is provided by the patient.  URI Presenting symptoms: congestion, ear pain, facial pain and rhinorrhea   Presenting symptoms: no cough, no fever and no sore throat   Severity:  Moderate Onset quality:  Gradual Duration:  2 weeks Progression:  Partially resolved Chronicity:  New Associated symptoms: sinus pain and sneezing   Associated symptoms: no swollen glands and no wheezing     Past Medical History  Diagnosis Date  . H/O myringotomy    History reviewed. No pertinent past surgical history. No family history on file. History  Substance Use Topics  . Smoking status: Never Smoker   . Smokeless tobacco: Not on file  . Alcohol Use: No   OB History   Grav Para Term Preterm Abortions TAB SAB Ect Mult Living                 Review of Systems  Constitutional: Negative.  Negative for fever.  HENT: Positive for congestion, ear pain, postnasal drip, rhinorrhea, sinus pressure and sneezing. Negative for sore throat.   Respiratory: Negative for cough and wheezing.   Cardiovascular: Negative.   Gastrointestinal: Negative.   Genitourinary: Negative.     Allergies  Review of patient's allergies indicates no known allergies.  Home Medications   Prior to Admission medications   Not on File   BP 152/87  Pulse 74  Temp(Src) 99.4 F (37.4 C) (Oral)  Resp 14  SpO2 100% Physical Exam  Nursing note and vitals reviewed. Constitutional: She is oriented to person, place, and time. She appears well-developed and well-nourished.  HENT:  Head: Normocephalic.  Right Ear: External ear normal.  Left Ear: External ear normal.  Nose: Mucosal edema and  rhinorrhea present.  Mouth/Throat: Oropharynx is clear and moist.  Eyes: Pupils are equal, round, and reactive to light.  Neck: Normal range of motion. Neck supple.  Cardiovascular: Regular rhythm and normal heart sounds.   Pulmonary/Chest: Effort normal and breath sounds normal.  Lymphadenopathy:    She has no cervical adenopathy.  Neurological: She is alert and oriented to person, place, and time.  Skin: Skin is warm and dry.    ED Course  Procedures (including critical care time) Labs Review Labs Reviewed - No data to display  Imaging Review No results found.   MDM   1. Seasonal allergic rhinitis        Linna HoffJames D Gilda Abboud, MD 04/12/14 [phone]

## 2014-09-23 ENCOUNTER — Emergency Department (INDEPENDENT_AMBULATORY_CARE_PROVIDER_SITE_OTHER): Payer: Medicaid Other

## 2014-09-23 ENCOUNTER — Encounter (HOSPITAL_COMMUNITY): Payer: Self-pay | Admitting: Emergency Medicine

## 2014-09-23 ENCOUNTER — Emergency Department (INDEPENDENT_AMBULATORY_CARE_PROVIDER_SITE_OTHER)
Admission: EM | Admit: 2014-09-23 | Discharge: 2014-09-23 | Disposition: A | Discharge status: Home / Self Care | Payer: Medicaid Other | Source: Home / Self Care | Attending: Family Medicine | Admitting: Family Medicine

## 2014-09-23 DIAGNOSIS — R252 Cramp and spasm: Secondary | ICD-10-CM

## 2014-09-23 DIAGNOSIS — R52 Pain, unspecified: Secondary | ICD-10-CM

## 2014-09-23 MED ORDER — CYCLOBENZAPRINE HCL 10 MG PO TABS: 10.0000 mg | ORAL_TABLET | Freq: Two times a day (BID) | ORAL | Status: DC | PRN

## 2014-09-23 NOTE — ED Provider Notes (Signed)
Medical screening examination/treatment/procedure(s) were performed by resident physician or non-physician practitioner and as supervising physician I was immediately available for consultation/collaboration.   Emberlie Gotcher DOUGLAS MD.   Mingo Siegert D Miroslav Gin, MD 09/23/14 1402 

## 2014-09-23 NOTE — ED Provider Notes (Signed)
CSN: [id number]     Arrival date & time 09/23/14  1125 History   First MD Initiated Contact with Patient 09/23/14 1303     Chief Complaint  Patient presents with  . Leg Pain   (Consider location/radiation/quality/duration/timing/severity/associated sxs/prior Treatment) HPI    33 year old female presents complaining of a strange sensation in her calf. This is been present for about 6 weeks but it has gotten more frequent in the past few days. She describes this as feeling somewhat like a cramp. It gets worse with activity and is somewhat relieved by rest. No fever, chills, NVD, leg swelling. No recent travel or sick contacts. No past medical history. She is not taking any medications.  Past Medical History  Diagnosis Date  . H/O myringotomy    History reviewed. No pertinent past surgical history. History reviewed. No pertinent family history. History  Substance Use Topics  . Smoking status: Never Smoker   . Smokeless tobacco: Not on file  . Alcohol Use: No   OB History   Grav Para Term Preterm Abortions TAB SAB Ect Mult Living                 Review of Systems  Cardiovascular: Negative for leg swelling.  Musculoskeletal:       Left calf pain   All other systems reviewed and are negative.   Allergies  Review of patient's allergies indicates no known allergies.  Home Medications   Prior to Admission medications   Medication Sig Start Date End Date Taking? Authorizing Provider  cetirizine (ZYRTEC) 10 MG tablet Take 1 tablet (10 mg total) by mouth daily. One tab daily for allergies 04/12/14   Linna HoffJames D Kindl, MD  cyclobenzaprine (FLEXERIL) 10 MG tablet Take 1 tablet (10 mg total) by mouth 2 (two) times daily as needed (for calf cramps). 09/23/14   Adrian BlackwaterZachary H Welborn Keena, PA-C  ipratropium (ATROVENT) 0.06 % nasal spray Place 2 sprays into both nostrils 4 (four) times daily. 04/12/14   Linna HoffJames D Kindl, MD   BP 148/91  Pulse 79  Temp(Src) 98.8 F (37.1 C) (Oral)  Resp 16  SpO2  100% Physical Exam  Nursing note and vitals reviewed. Constitutional: She is oriented to person, place, and time. Vital signs are normal. She appears well-developed and well-nourished. No distress.  HENT:  Head: Normocephalic and atraumatic.  Cardiovascular:  Pulses:      Dorsalis pedis pulses are 2+ on the left side.  Pulmonary/Chest: Effort normal. No respiratory distress.  Musculoskeletal:       Left lower leg: Normal.  No calf tenderness.  No swelling.    Neurological: She is alert and oriented to person, place, and time. She has normal strength and normal reflexes. No sensory deficit. Coordination and gait normal.  Skin: Skin is warm and dry. No rash noted. She is not diaphoretic.  Psychiatric: She has a normal mood and affect. Judgment normal.    ED Course  Procedures (including critical care time) Labs Review Labs Reviewed - No data to display  Imaging Review Dg Tibia/fibula Left  09/23/2014   CLINICAL DATA:  Midchest pain, tingling, cramping, no known injury  EXAM: LEFT TIBIA AND FIBULA - 2 VIEW  COMPARISON:  None.  FINDINGS: There is no evidence of fracture or other focal bone lesions. Soft tissues are unremarkable.  IMPRESSION: Negative.   Electronically Signed   By: Elige KoHetal  Patel   On: 09/23/2014 13:37     MDM   1. Pain   2. Calf cramp  Patient is adamant that there is something in her cath should not be there are no indication of soft tissue injury, mass, bony abnormality, DVT, infection, claudication. Consider what is causing her symptoms. We'll attempt treatment with Flexeril for now.  F/u with ortho    Meds ordered this encounter  Medications  . cyclobenzaprine (FLEXERIL) 10 MG tablet    Sig: Take 1 tablet (10 mg total) by mouth 2 (two) times daily as needed (for calf cramps).    Dispense:  20 tablet    Refill:  0    Order Specific Question:  Supervising Provider    Answer:  Bradd CanaryKINDL, JAMES D [[id number]]       Graylon GoodZachary H Hassani Sliney, PA-C 09/23/14 1358

## 2014-09-23 NOTE — ED Notes (Signed)
Pt  Reports  l   Calf  Pain    intermittant  For  6  Weeks     Worse  Over  Last  Several  Days      -         denys  Any  Injury        -         denys  Any      Chest  Pain              Speaking  In  Complete  sentances

## 2014-09-23 NOTE — Discharge Instructions (Signed)
Leg Cramps Leg cramps that occur during exercise can be caused by poor circulation or dehydration. However, muscle cramps that occur at rest or during the night are usually not due to any serious medical problem. Heat cramps may cause muscle spasms during hot weather.  CAUSES There is no clear cause for muscle cramps. However, dehydration may be a factor for those who do not drink enough fluids and those who exercise in the heat. Imbalances in the level of sodium, potassium, calcium or magnesium in the muscle tissue may also be a factor. Some medications, such as water pills (diuretics), may cause loss of chemicals that the body needs (like sodium and potassium) and cause muscle cramps. TREATMENT   Make sure your diet has enough fluids and essential minerals for the muscle to work normally.  Avoid strenuous exercise for several days if you have been having frequent leg cramps.  Stretch and massage the cramped muscle for several minutes.  Some medicines may be helpful in some patients with night cramps. Only take over-the-counter or prescription medicines as directed by your caregiver. SEEK IMMEDIATE MEDICAL CARE IF:   Your leg cramps become worse.  Your foot becomes cold, numb, or blue. Document Released: 01/02/2005 Document Revised: 02/17/2012 Document Reviewed: 12/20/2008 Exit[employer] Patient Information 2015 [employer]. This information is not intended to replace advice given to you by your health care provider. Make sure you discuss any questions you have with your health care provider.  

## 2014-12-18 ENCOUNTER — Encounter (HOSPITAL_COMMUNITY): Payer: Self-pay | Admitting: *Deleted

## 2014-12-18 ENCOUNTER — Emergency Department (HOSPITAL_COMMUNITY)
Admission: EM | Admit: 2014-12-18 | Discharge: 2014-12-18 | Disposition: A | Discharge status: Home / Self Care | Payer: Medicaid Other | Attending: Emergency Medicine | Admitting: Emergency Medicine

## 2014-12-18 DIAGNOSIS — Z9889 Other specified postprocedural states: Secondary | ICD-10-CM | POA: Diagnosis not present

## 2014-12-18 DIAGNOSIS — G8911 Acute pain due to trauma: Secondary | ICD-10-CM | POA: Diagnosis not present

## 2014-12-18 DIAGNOSIS — M79669 Pain in unspecified lower leg: Secondary | ICD-10-CM

## 2014-12-18 DIAGNOSIS — M79662 Pain in left lower leg: Secondary | ICD-10-CM

## 2014-12-18 DIAGNOSIS — Z79899 Other long term (current) drug therapy: Secondary | ICD-10-CM | POA: Insufficient documentation

## 2014-12-18 MED ORDER — IBUPROFEN 800 MG PO TABS: 800.0000 mg | ORAL_TABLET | Freq: Three times a day (TID) | ORAL | Status: AC

## 2014-12-18 MED ORDER — TRAMADOL HCL 50 MG PO TABS: 50.0000 mg | ORAL_TABLET | Freq: Four times a day (QID) | ORAL | Status: DC | PRN

## 2014-12-18 NOTE — ED Notes (Signed)
Patient transported to [facility]

## 2014-12-18 NOTE — ED Provider Notes (Signed)
CSN: [id number]     Arrival date & time 12/18/14  1212 History   First MD Initiated Contact with Patient 12/18/14 1604     Chief Complaint  Patient presents with  . Leg Pain     (Consider location/radiation/quality/duration/timing/severity/associated sxs/prior Treatment) HPI Comments: Patient presents to the ER for evaluation of left calf pain. Patient reports that she has been experiencing symptoms for several months. Initially she would have intermittent pain. In October she went to urgent care and was told that it was a muscle strain and spasm, prescribed Flexeril. Since then the pain has worsened. She is now experiencing severe and constant pain over last 1 or 2 weeks. She has not had any chest pain or shortness of breath.  Patient is a 34 y.o. female presenting with leg pain.  Leg Pain   Past Medical History  Diagnosis Date  . H/O myringotomy    History reviewed. No pertinent past surgical history. No family history on file. History  Substance Use Topics  . Smoking status: Never Smoker   . Smokeless tobacco: Not on file  . Alcohol Use: No   OB History    No data available     Review of Systems  Musculoskeletal: Positive for myalgias.  All other systems reviewed and are negative.     Allergies  Review of patient's allergies indicates no known allergies.  Home Medications   Prior to Admission medications   Medication Sig Start Date End Date Taking? Authorizing Provider  cetirizine (ZYRTEC) 10 MG tablet Take 1 tablet (10 mg total) by mouth daily. One tab daily for allergies 04/12/14  Yes Linna HoffJames D Kindl, MD  ipratropium (ATROVENT) 0.06 % nasal spray Place 2 sprays into both nostrils 4 (four) times daily. 04/12/14  Yes Linna HoffJames D Kindl, MD  cyclobenzaprine (FLEXERIL) 10 MG tablet Take 1 tablet (10 mg total) by mouth 2 (two) times daily as needed (for calf cramps). Patient not taking: Reported on 12/18/2014 09/23/14   Graylon GoodZachary H Baker, PA-C   BP 157/80 mmHg  Pulse 99   Temp(Src) 98.5 F (36.9 C) (Oral)  Resp 18  Wt 135 lb (61.236 kg)  SpO2 99% Physical Exam  Constitutional: She is oriented to person, place, and time. She appears well-developed and well-nourished. No distress.  HENT:  Head: Normocephalic and atraumatic.  Right Ear: Hearing normal.  Left Ear: Hearing normal.  Nose: Nose normal.  Mouth/Throat: Oropharynx is clear and moist and mucous membranes are normal.  Eyes: Conjunctivae and EOM are normal. Pupils are equal, round, and reactive to light.  Neck: Normal range of motion. Neck supple.  Cardiovascular: Regular rhythm, S1 normal and S2 normal.  Exam reveals no gallop and no friction rub.   No murmur heard. Pulses:      Dorsalis pedis pulses are 2+ on the left side.  Pulmonary/Chest: Effort normal and breath sounds normal. No respiratory distress. She exhibits no tenderness.  Abdominal: Soft. Normal appearance and bowel sounds are normal. There is no hepatosplenomegaly. There is no tenderness. There is no rebound, no guarding, no tenderness at McBurney's point and negative Murphy's sign. No hernia.  Musculoskeletal: Normal range of motion.       Left lower leg: She exhibits tenderness.  Neurological: She is alert and oriented to person, place, and time. She has normal strength. No cranial nerve deficit or sensory deficit. Coordination normal. GCS eye subscore is 4. GCS verbal subscore is 5. GCS motor subscore is 6.  Skin: Skin is warm, dry and intact.  No rash noted. No cyanosis.  Psychiatric: She has a normal mood and affect. Her speech is normal and behavior is normal. Thought content normal.  Nursing note and vitals reviewed.   ED Course  Procedures (including critical care time) Labs Review Labs Reviewed - No data to display  Imaging Review No results found.   EKG Interpretation None      MDM   Final diagnoses:  None   left calf pain  Patient presents to the ER for evaluation of pain in the left calf. Symptoms have been  ongoing for several months but have progressively worsened. She is now complaining of continuous pain in the calf. Examination reveals mild tenderness without any significant swelling. She has normal pulses. No overlying skin changes to suggest infection. Venous duplex was negative for DVT. Patient reassured, treat with analgesia, rest. Follow-up with ortho.    Gilda Crease, MD 12/18/14 [phone]

## 2014-12-18 NOTE — ED Notes (Signed)
Pt has been having this feeling of tightness in left calf and feels like something is in there.  Pt states it was intermittent, but now constant.  Pulse palpable.  Pain with walking.  No swelling.  Pain has been there for 4 months.

## 2014-12-18 NOTE — Progress Notes (Signed)
VASCULAR LAB PRELIMINARY  PRELIMINARY  PRELIMINARY  PRELIMINARY  Left lower extremity venous Doppler completed.    Preliminary report:  There is no DVT or SVT noted in the left lower extremity.   Darin Redmann, RVT 12/18/2014, 6:30 PM

## 2014-12-18 NOTE — Discharge Instructions (Signed)

## 2015-05-06 ENCOUNTER — Emergency Department (HOSPITAL_COMMUNITY)
Admission: EM | Admit: 2015-05-06 | Discharge: 2015-05-06 | Disposition: A | Discharge status: Home / Self Care | Payer: Medicaid Other | Attending: Family Medicine | Admitting: Family Medicine

## 2015-05-06 ENCOUNTER — Encounter (HOSPITAL_COMMUNITY): Payer: Self-pay | Admitting: *Deleted

## 2015-05-06 DIAGNOSIS — J020 Streptococcal pharyngitis: Secondary | ICD-10-CM

## 2015-05-06 LAB — POCT RAPID STREP A: STREPTOCOCCUS, GROUP A SCREEN (DIRECT): NEGATIVE

## 2015-05-06 MED ORDER — CEFDINIR 300 MG PO CAPS: 300.0000 mg | ORAL_CAPSULE | Freq: Two times a day (BID) | ORAL | Status: DC

## 2015-05-06 NOTE — ED Notes (Signed)
Pt  Reports   Symptoms    Of    Body  Aches       sorethroat        Fever         r    Ear  Ache /  Pounding   Sensations   In the  Ear        With  Symptoms  X 3  Days       symptoms  Not  releived   By    otc tylenol   / cough drops

## 2015-05-06 NOTE — Discharge Instructions (Signed)
Drink lots of fluids, take all of medicine, use lozenges as needed.return if needed °

## 2015-05-06 NOTE — ED Provider Notes (Signed)
CSN: [id number]     Arrival date & time 05/06/15  1635 History   First MD Initiated Contact with Patient 05/06/15 1716     Chief Complaint  Patient presents with  . Sore Throat   (Consider location/radiation/quality/duration/timing/severity/associated sxs/prior Treatment) Patient is a 34 y.o. female presenting with pharyngitis. The history is provided by the patient.  Sore Throat This is a new problem. The current episode started more than 2 days ago. The problem has been gradually worsening. Pertinent negatives include no chest pain and no abdominal pain.    Past Medical History  Diagnosis Date  . H/O myringotomy    History reviewed. No pertinent past surgical history. History reviewed. No pertinent family history. History  Substance Use Topics  . Smoking status: Never Smoker   . Smokeless tobacco: Not on file  . Alcohol Use: No   OB History    No data available     Review of Systems  Constitutional: Positive for fever, chills and appetite change.  HENT: Positive for ear pain. Negative for ear discharge.   Respiratory: Negative.   Cardiovascular: Negative.  Negative for chest pain.  Gastrointestinal: Negative for abdominal pain.    Allergies  Review of patient's allergies indicates no known allergies.  Home Medications   Prior to Admission medications   Medication Sig Start Date End Date Taking? Authorizing Provider  cefdinir (OMNICEF) 300 MG capsule Take 1 capsule (300 mg total) by mouth 2 (two) times daily. 05/06/15   Linna HoffJames D Kindl, MD  cetirizine (ZYRTEC) 10 MG tablet Take 1 tablet (10 mg total) by mouth daily. One tab daily for allergies 04/12/14   Linna HoffJames D Kindl, MD  cyclobenzaprine (FLEXERIL) 10 MG tablet Take 1 tablet (10 mg total) by mouth 2 (two) times daily as needed (for calf cramps). Patient not taking: Reported on 12/18/2014 09/23/14   Graylon GoodZachary H Baker, PA-C  ibuprofen (ADVIL,MOTRIN) 800 MG tablet Take 1 tablet (800 mg total) by mouth 3 (three) times daily.  12/18/14   Gilda Creasehristopher J Pollina, MD  ipratropium (ATROVENT) 0.06 % nasal spray Place 2 sprays into both nostrils 4 (four) times daily. 04/12/14   Linna HoffJames D Kindl, MD  traMADol (ULTRAM) 50 MG tablet Take 1 tablet (50 mg total) by mouth every 6 (six) hours as needed. 12/18/14   Gilda Creasehristopher J Pollina, MD   BP 132/83 mmHg  Pulse 114  Temp(Src) 101.4 F (38.6 C) (Oral)  Resp 16  SpO2 100% Physical Exam  Constitutional: She is oriented to person, place, and time. She appears well-developed and well-nourished.  HENT:  Head: Normocephalic.  Right Ear: External ear normal.  Left Ear: External ear normal.  Mouth/Throat: Oropharyngeal exudate present.  Neck: Neck supple.  Lymphadenopathy:    She has cervical adenopathy.  Neurological: She is alert and oriented to person, place, and time.  Skin: Skin is warm and dry.  Nursing note and vitals reviewed.   ED Course  Procedures (including critical care time) Labs Review Labs Reviewed  POCT RAPID STREP A   Strep neg. Imaging Review No results found.   MDM   1. Streptococcal sore throat       Linna HoffJames D Kindl, MD 05/06/15 [phone]

## 2015-05-09 LAB — CULTURE, GROUP A STREP: Strep A Culture: POSITIVE — AB

## 2016-02-16 IMAGING — CR DG TIBIA/FIBULA 2V*L*
2 series · 2 of 2 positions shown · non-contrast
Comparison: None.

CLINICAL DATA: Midchest pain, tingling, cramping, no known injury

EXAM:
LEFT TIBIA AND FIBULA - 2 VIEW

[tibia ap]
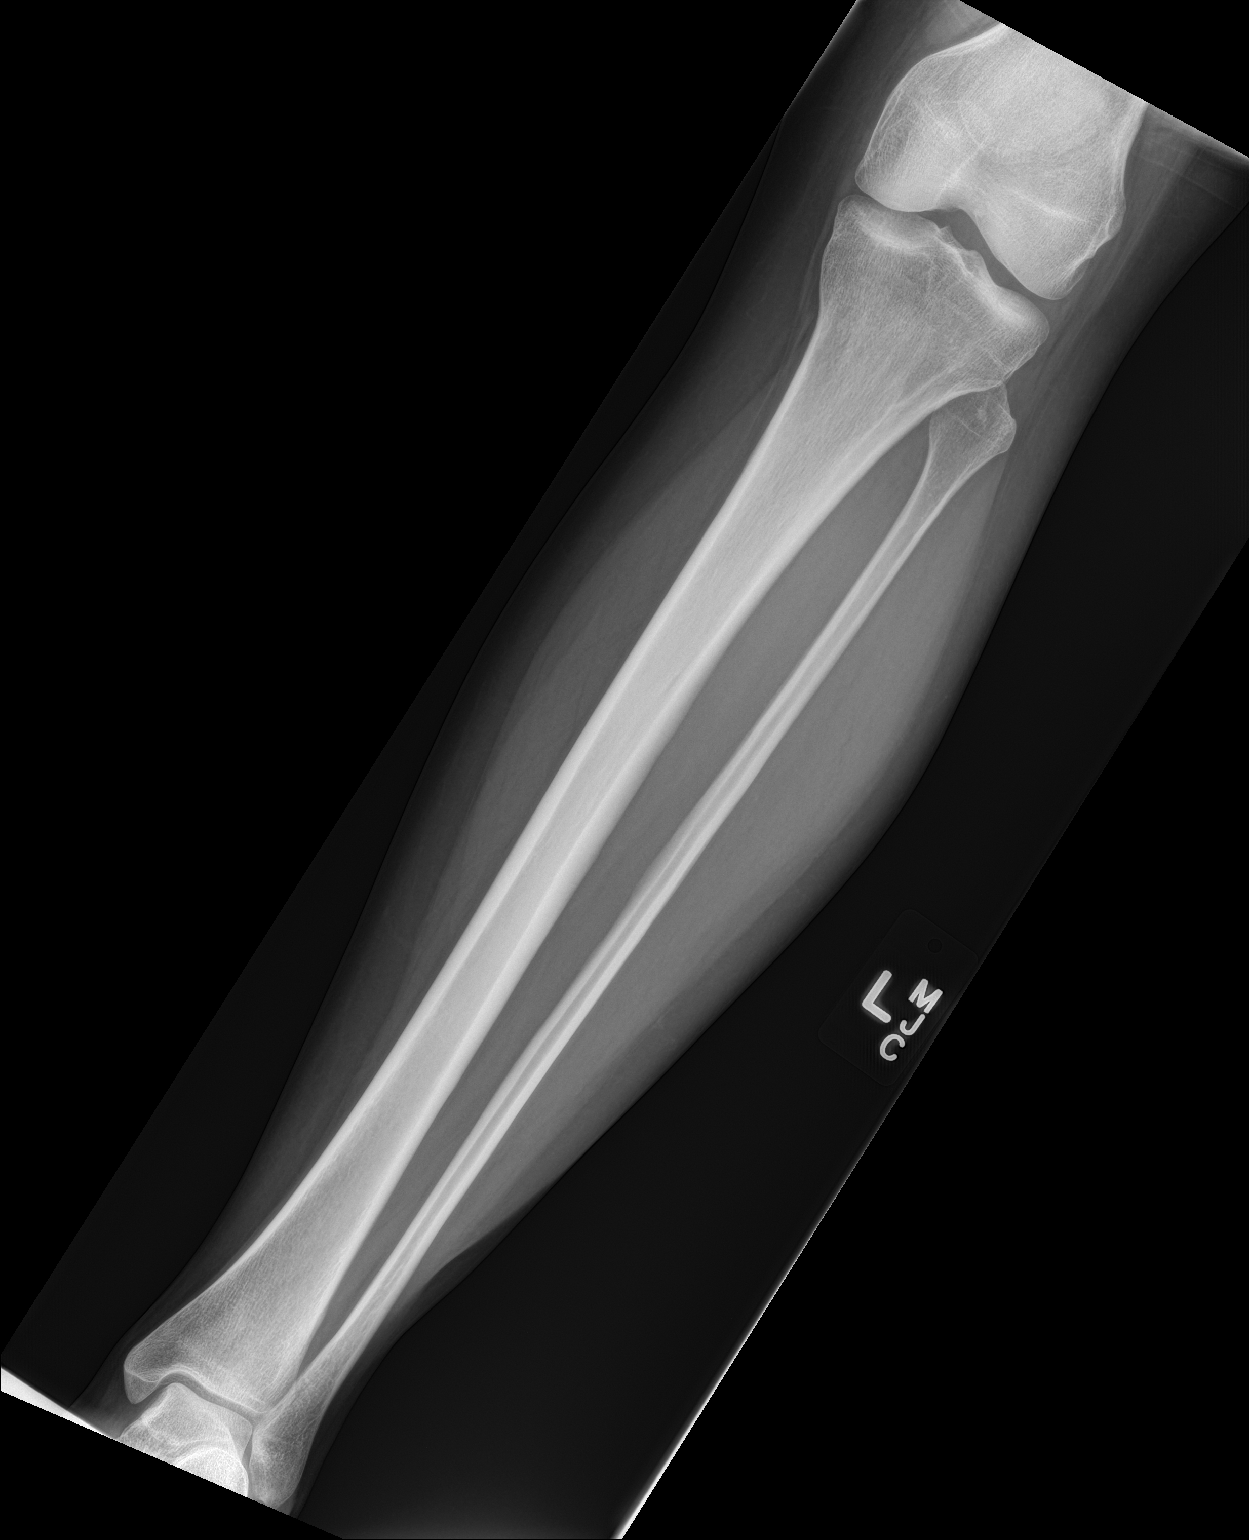

[tibia lat]
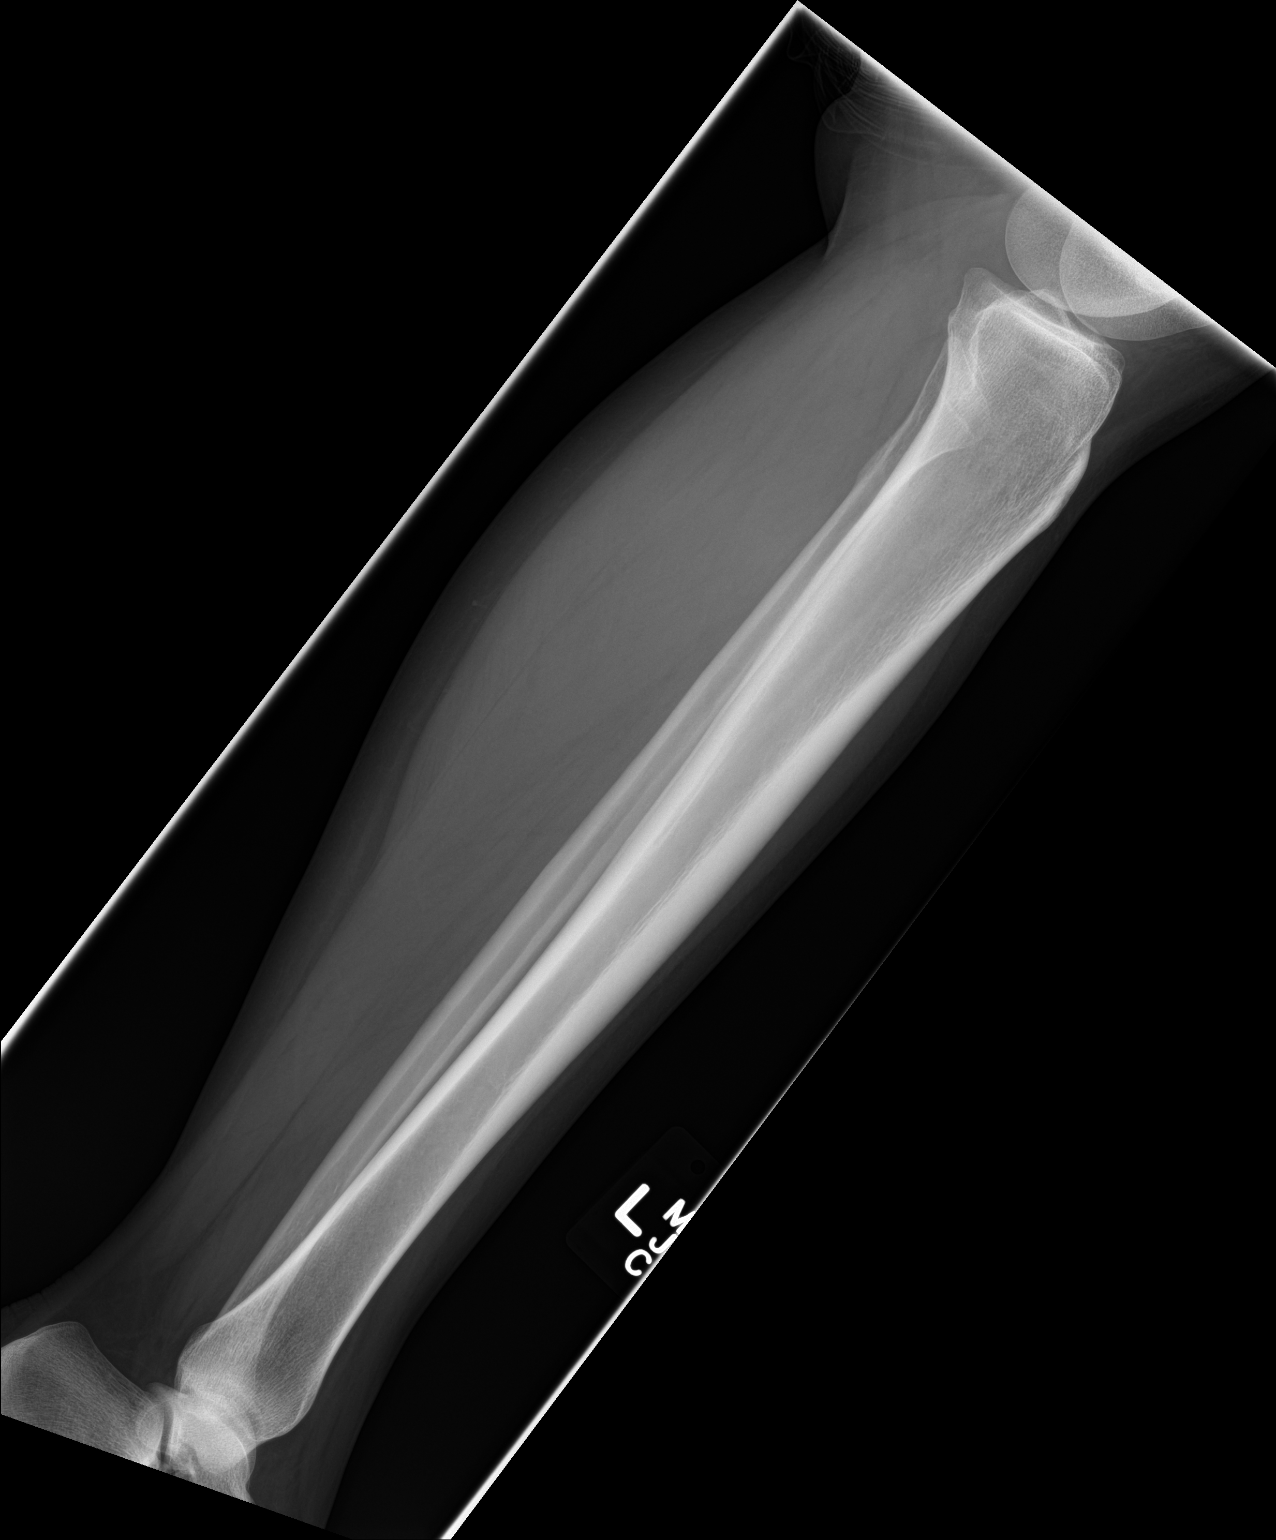

[2 of 2 positions shown; findings below may reference images not displayed]

FINDINGS: There is no evidence of fracture or other focal bone lesions. Soft
tissues are unremarkable.
IMPRESSION: Negative.

## 2018-01-14 ENCOUNTER — Encounter: Payer: Self-pay | Admitting: Physician Assistant

## 2018-01-14 ENCOUNTER — Other Ambulatory Visit: Payer: Self-pay

## 2018-01-14 ENCOUNTER — Ambulatory Visit: Payer: Self-pay | Admitting: Physician Assistant

## 2018-01-14 VITALS — BP 116/80 | HR 108 | Temp 98.8°F | Resp 18 | Ht 62.0 in | Wt 148.2 lb

## 2018-01-14 DIAGNOSIS — H7292 Unspecified perforation of tympanic membrane, left ear: Secondary | ICD-10-CM

## 2018-01-14 NOTE — Patient Instructions (Addendum)
Avoid getting water into your ear canal Make appointment with ENT   IF you received an x-ray today, you will receive an invoice from [facility]. Please contact [facility] at [phone] with questions or concerns regarding your invoice.   IF you received labwork today, you will receive an invoice from [facility]. Please contact [facility] at [phone] with questions or concerns regarding your invoice.   Our billing staff will not be able to assist you with questions regarding bills from these companies.  You will be contacted with the lab results as soon as they are available. The fastest way to get your results is to activate your My Chart account. Instructions are located on the last page of this paperwork. If you have not heard from us regarding the results in 2 weeks, please contact this office.     Eardrum Rupture, Adult An eardrum rupture is a hole (perforation) in the eardrum. The eardrum is a thin, round tissue inside of the ear that separates the ear canal from the middle ear. The eardrum is also called the tympanic membrane. It transfers sound vibrations through small bones in the middle ear to the hearing nerve in the inner ear. It also protects the middle ear from germs. An eardrum rupture can cause pain and hearing loss. What are the causes? This condition may be caused by:  An infection.  A sudden injury, such as from: ? Inserting a thin, sharp object into the ear. ? A hit to the side of the head, especially by an open hand. ? Falling onto water or a flat surface. ? A rapid change in pressure, such as from flying or scuba diving. ? A sudden increase in pressure against the eardrum, such as from an explosion or a very loud noise.  Inserting a cotton-tipped swab in the ear.  A long-term eustachian tube disorder. Eustachian tubes are parts of the body that connect each middle ear space to the back of the nose.  A medical procedure or surgery, such as a  procedure to remove wax from the ear canal.  Removing a man-made pressure equalization tube(PE tube) that was placed through the eardrum.  Having a PE tube fall out.  What increases the risk? You are more likely to develop this condition if:  You have had PE tubes inserted in your ears.  You have an ear infection.  You play sports that: ? Involve balls or contact with other players. ? Take place in water, such as diving, scuba diving, or waterskiing.  What are the signs or symptoms? Symptoms of this condition include:  Sudden pain at the time of the injury.  Ear pain that suddenly improves.  Ringing in the ear after the injury.  Drainage from the ear. The drainage may be clear, cloudy or pus-like, or bloody.  Hearing loss.  Dizziness.  How is this diagnosed? This condition is diagnosed based on your symptoms and medical history as well as a physical exam. Your health care provider can usually see a perforation using an ear scope (otoscope). You may have tests, such as:  A hearing test (audiogram) to check for hearing loss.  A test in which a sample of ear drainage is tested for infection (culture).  How is this treated? An eardrum typically heals on its own within a few weeks. If your eardrum does not heal, your health care provider may recommend a procedure to place a patch over your eardrum or surgery to repair your eardrum. Your health care  provider may also prescribe antibiotic medicines to help prevent infection. If the ear heals completely, any hearing loss should be temporary. Follow these instructions at home:  Keep your ear dry. This is very important. Follow instructions from your health care provider about how to keep your ear dry. You may need to wear waterproof earplugs when bathing and swimming.  Take over-the-counter and prescription medicines only as told by your health care provider.  Return to sports and activities as told by your health care  provider. Ask your health care provider what activities are safe for you.  Wear headgear with ear protection when you play sports in which ear injuries are common.  If directed, apply heat to your affected ear as often as told by your health care provider. Use the heat source that your health care provider recommends, such as a moist heat pack or a heating pad. This will help to relieve pain. ? Place a towel between your skin and the heat source. ? Leave the heat on for 20-30 minutes. ? Remove the heat if your skin turns bright red. This is especially important if you are unable to feel pain, heat, or cold. You may have a greater risk of getting burned.  Keep all follow-up visits as told by your health care provider. This is important.  Talk to your health care provider before traveling by plane. Contact a health care provider if:  You have mucus or blood draining from your ear.  You have a fever.  You have ear pain.  You have hearing loss, dizziness, or ringing in your ear. Get help right away if:  You have sudden hearing loss.  You are very dizzy.  You have severe ear pain.  Your face feels weak or becomes paralyzed. Summary  An eardrum rupture is a hole (perforation) in the eardrum that can cause pain and hearing loss. It is usually caused by a sudden injury to the ear.  The eardrum will likely heal on its own within a few weeks. In some cases, surgery may be necessary.  After the injury, follow instructions from your health care provider about how to keep your ear dry as it heals. This information is not intended to replace advice given to you by your health care provider. Make sure you discuss any questions you have with your health care provider. Document Released: 11/22/2000 Document Revised: 01/31/2017 Document Reviewed: 01/31/2017 Elsevier Interactive Patient Education  [employer].

## 2018-01-14 NOTE — Progress Notes (Signed)
Subjective:    Patient ID: Samantha Bates, female    DOB: 1981/08/06, 37 y.o.   MRN: [id number]  Chief Complaint  Patient presents with  . Ear Pain    left ear congestion and feels full x2weeks had some hoarseness and cough for a couple days but has gone away    Patient presents today for left ear fullness x 2 weeks. 2 weeks ago she lost her voice and had a cough, but also noticed a pain in her left ear. The pain in her left ear went away the following day. Cough and hoarseness cleared, but the fullness persisted. Notes hearing loss in the left ear. Was taking OTC allergy medication, Cold+Flu, and Sinex.   History of chronic ear infections as a child and myringotomy with Eustachian tubes placed bilaterally.   Denies use of Q-tips or drainage from the ear.   Review of Systems  Constitutional: Negative for chills and fever.  HENT: Positive for hearing loss and tinnitus. Negative for ear discharge, sinus pressure, sinus pain and voice change. Ear pain: fullness.   Eyes: Negative for visual disturbance.  Respiratory: Negative for cough, shortness of breath, wheezing and stridor.   Cardiovascular: Negative for chest pain, palpitations and leg swelling.  Gastrointestinal: Negative for constipation, diarrhea, nausea and vomiting.  Neurological: Negative for dizziness, weakness, light-headedness, numbness and headaches.   There are no active problems to display for this patient.  Prior to Admission medications   Medication Sig Start Date End Date Taking? Authorizing Provider  ibuprofen (ADVIL,MOTRIN) 800 MG tablet Take 1 tablet (800 mg total) by mouth 3 (three) times daily. Patient not taking: Reported on 01/14/2018 12/18/14   Gilda Crease, MD   No Known Allergies     Objective:   Physical Exam  Constitutional: She is oriented to person, place, and time. She appears well-developed and well-nourished. No distress.  HENT:  Head: Normocephalic.  Left Ear: No drainage or  tenderness. Tympanic membrane is perforated.  Nose: Nose normal. Right sinus exhibits no maxillary sinus tenderness and no frontal sinus tenderness. Left sinus exhibits no maxillary sinus tenderness and no frontal sinus tenderness.  Mouth/Throat: Uvula is midline, oropharynx is clear and moist and mucous membranes are normal.  Right ear: cerumen blocking full visualization of TM Left ear: perforated TM, no signs of infection  Eyes: Conjunctivae and lids are normal.  Cardiovascular: Normal rate, regular rhythm, normal heart sounds and intact distal pulses. Exam reveals no gallop and no friction rub.  No murmur heard. Pulmonary/Chest: Effort normal and breath sounds normal. No respiratory distress. She has no wheezes. She has no rales. She exhibits no tenderness.  Musculoskeletal: She exhibits no edema.  Lymphadenopathy:       Head (right side): No submental, no submandibular, no tonsillar, no preauricular, no posterior auricular and no occipital adenopathy present.       Head (left side): No submental, no submandibular, no tonsillar, no preauricular, no posterior auricular and no occipital adenopathy present.    She has no cervical adenopathy.  Neurological: She is alert and oriented to person, place, and time.  Psychiatric: She has a normal mood and affect. Her behavior is normal.   Blood pressure 116/80, pulse (!) 108, temperature 98.8 F (37.1 C), temperature source Oral, resp. rate 18, height 5\' 2"  (1.575 m), weight 148 lb 3.2 oz (67.2 kg), SpO2 98 %.     Assessment & Plan:  1. Tympanic membrane perforation, left Instructed patient to avoid getting water into her ear  canal. When showering, use ear plugs. No signs of infection. Advised patient TM will usually heal on on its own in a few weeks, but this is not always the case. Due to the risk of permanent hearing loss if TM does not heal properly, refer to ENT to ensure proper management of perforation.  - Ambulatory referral to  ENT  Return if symptoms worsen or fail to improve.  Alfonse Alpersaroline Rikita Grabert, PA-S

## 2018-01-14 NOTE — Progress Notes (Signed)
Patient ID: Samantha Bates, female     DOB: [birth year], 37 y.o.    MRN: [id number]  PCP: Patient, No Pcp Per  Chief Complaint  Patient presents with  . Ear Pain    left ear congestion and feels full x2weeks had some hoarseness and cough for a couple days but has gone away     Subjective:   This patient is new to me and presents for evaluation of LEFT ear fullness.  Symptoms x 2 weeks. Initially associated with LEFT ear pain, laryngitis and cough. Ear pain resolved after only 1 days. Cough and laryngitis resolved later. Fullness sensation in the LEFT ear persists, associated with hearing loss. Denies using Q-tips in the ear, having drainage from the ear.  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  Review of Systems  Constitutional: Negative.   HENT: Positive for hearing loss and tinnitus. Negative for congestion (resolved), dental problem, drooling, ear discharge, ear pain (resolved), facial swelling, mouth sores, nosebleeds, postnasal drip, rhinorrhea, sinus pressure, sinus pain, sneezing, sore throat, trouble swallowing and voice change (resolved).   Eyes: Negative for visual disturbance.  Respiratory: Negative for cough (resolved), chest tightness, shortness of breath and wheezing.   Cardiovascular: Negative for chest pain and palpitations.  Gastrointestinal: Negative for abdominal pain, diarrhea, nausea and vomiting.  Genitourinary: Negative for dysuria, frequency, hematuria and urgency.  Musculoskeletal: Negative for arthralgias and myalgias.  Skin: Negative for rash.  Neurological: Negative for dizziness, weakness and headaches.  Psychiatric/Behavioral: Negative for decreased concentration. The patient is not nervous/anxious.      Prior to Admission medications   Medication Sig Start Date End Date Taking? Authorizing Provider  ibuprofen (ADVIL,MOTRIN) 800 MG tablet Take 1 tablet (800 mg total) by mouth 3 (three) times  daily. Patient not taking: Reported on 01/14/2018 12/18/14   Gilda CreasePollina, Christopher J, MD      No Known Allergies   There are no active problems to display for this patient.    Family History  Problem Relation Age of Onset  . Heart disease Mother   . Hyperlipidemia Mother   . Hypertension Mother      Social History   Socioeconomic History  . Marital status: Single    Spouse name: Not on file  . Number of children: 3  . Years of education: Not on file  . Highest education level: Associate degree: occupational, Scientist, product/process developmenttechnical, or vocational program  Social Needs  . Financial resource strain: Not on file  . Food insecurity - worry: Not on file  . Food insecurity - inability: Not on file  . Transportation needs - medical: Not on file  . Transportation needs - non-medical: Not on file  Occupational History    Employer: [employer]  Tobacco Use  . Smoking status: Never Smoker  . Smokeless tobacco: Never Used  Substance and Sexual Activity  . Alcohol use: No  . Drug use: No  . Sexual activity: Yes    Birth control/protection: IUD  Other Topics Concern  . Not on file  Social History Narrative  . Not on file         Objective:  Physical Exam  Constitutional: She is oriented to person, place, and time. She appears well-developed and well-nourished. She is active and cooperative. No distress.  BP 116/80   Pulse (!) 108   Temp 98.8 F (37.1 C) (Oral)   Resp 18   Ht 5\' 2"  (1.575 m)   Wt 148 lb  3.2 oz (67.2 kg)   SpO2 98%   BMI 27.11 kg/m   HENT:  Head: Normocephalic and atraumatic.  Right Ear: Hearing and external ear normal. No lacerations. No drainage, swelling or tenderness. Right ear foreign body: cerumen in the cancal obscurs visualization of the TM. No mastoid tenderness. No middle ear effusion. No decreased hearing is noted.  Left Ear: External ear and ear canal normal. No lacerations. No drainage, swelling or tenderness. No foreign bodies. No mastoid tenderness.  Tympanic membrane is perforated. Tympanic membrane is not injected, not scarred, not erythematous, not retracted and not bulging.  No middle ear effusion. No hemotympanum. Decreased hearing is noted.  Eyes: Conjunctivae are normal. No scleral icterus.  Neck: Normal range of motion. Neck supple. No thyromegaly present.  Cardiovascular: Normal rate, regular rhythm and normal heart sounds.  Pulses:      Radial pulses are 2+ on the right side, and 2+ on the left side.  Pulmonary/Chest: Effort normal and breath sounds normal.  Lymphadenopathy:       Head (right side): No tonsillar, no preauricular, no posterior auricular and no occipital adenopathy present.       Head (left side): No tonsillar, no preauricular, no posterior auricular and no occipital adenopathy present.    She has no cervical adenopathy.       Right: No supraclavicular adenopathy present.       Left: No supraclavicular adenopathy present.  Neurological: She is alert and oriented to person, place, and time. No sensory deficit.  Skin: Skin is warm, dry and intact. No rash noted. No cyanosis or erythema. Nails show no clubbing.  Psychiatric: She has a normal mood and affect. Her speech is normal and behavior is normal.        Assessment & Plan:  1. Tympanic membrane perforation, left Supportive care.  Anticipatory guidance.  - Ambulatory referral to ENT    Return if symptoms worsen or fail to improve.   Fernande Bras, PA-C [facility]

## 2018-10-01 ENCOUNTER — Encounter

## 2023-09-12 ENCOUNTER — Emergency Department (HOSPITAL_BASED_OUTPATIENT_CLINIC_OR_DEPARTMENT_OTHER)
Admission: EM | Admit: 2023-09-12 | Discharge: 2023-09-13 | Disposition: A | Discharge status: Home / Self Care | Payer: BC Managed Care – PPO | Attending: Emergency Medicine | Admitting: Emergency Medicine

## 2023-09-12 ENCOUNTER — Emergency Department (HOSPITAL_BASED_OUTPATIENT_CLINIC_OR_DEPARTMENT_OTHER): Payer: BC Managed Care – PPO

## 2023-09-12 ENCOUNTER — Encounter (HOSPITAL_BASED_OUTPATIENT_CLINIC_OR_DEPARTMENT_OTHER): Payer: Self-pay

## 2023-09-12 ENCOUNTER — Other Ambulatory Visit: Payer: Self-pay

## 2023-09-12 DIAGNOSIS — M546 Pain in thoracic spine: Secondary | ICD-10-CM | POA: Insufficient documentation

## 2023-09-12 DIAGNOSIS — R109 Unspecified abdominal pain: Secondary | ICD-10-CM | POA: Diagnosis not present

## 2023-09-12 LAB — URINALYSIS, ROUTINE W REFLEX MICROSCOPIC
Bilirubin Urine: NEGATIVE
Glucose, UA: NEGATIVE mg/dL
Hgb urine dipstick: NEGATIVE
Ketones, ur: NEGATIVE mg/dL
Leukocytes,Ua: NEGATIVE
Nitrite: NEGATIVE
Protein, ur: NEGATIVE mg/dL
Specific Gravity, Urine: >=1.03 (ref 1.005–1.030)
pH: 5.5 (ref 5.0–8.0)

## 2023-09-12 LAB — CBC
HCT: 41 % (ref 36.0–46.0)
Hemoglobin: 13.9 g/dL (ref 12.0–15.0)
MCH: 30.6 pg (ref 26.0–34.0)
MCHC: 33.9 g/dL (ref 30.0–36.0)
MCV: 90.3 fL (ref 80.0–100.0)
Platelets: 261 10*3/uL (ref 150–400)
RBC: 4.54 MIL/uL (ref 3.87–5.11)
RDW: 12.2 % (ref 11.5–15.5)
WBC: 9.6 10*3/uL (ref 4.0–10.5)
nRBC: 0 % (ref 0.0–0.2)

## 2023-09-12 LAB — HEPATIC FUNCTION PANEL
ALT: 17 U/L (ref 0–44)
AST: 20 U/L (ref 15–41)
Albumin: 4.2 g/dL (ref 3.5–5.0)
Alkaline Phosphatase: 59 U/L (ref 38–126)
Bilirubin, Direct: 0.1 mg/dL (ref 0.0–0.2)
Indirect Bilirubin: 0.7 mg/dL (ref 0.3–0.9)
Total Bilirubin: 0.8 mg/dL (ref 0.3–1.2)
Total Protein: 7.9 g/dL (ref 6.5–8.1)

## 2023-09-12 LAB — BASIC METABOLIC PANEL
Anion gap: 15 (ref 5–15)
BUN: 15 mg/dL (ref 6–20)
CO2: 24 mmol/L (ref 22–32)
Calcium: 9.1 mg/dL (ref 8.9–10.3)
Chloride: 98 mmol/L (ref 98–111)
Creatinine, Ser: 0.61 mg/dL (ref 0.44–1.00)
GFR, Estimated: >60 mL/min (ref >60)
Glucose, Bld: 102 mg/dL — ABNORMAL HIGH (ref 70–99)
Potassium: 3.5 mmol/L (ref 3.5–5.1)
Sodium: 137 mmol/L (ref 135–145)

## 2023-09-12 LAB — PREGNANCY, URINE: Preg Test, Ur: NEGATIVE

## 2023-09-12 LAB — LIPASE, BLOOD: Lipase: 24 U/L (ref 11–51)

## 2023-09-12 MED ORDER — DIAZEPAM 5 MG PO TABS
5.0000 mg | ORAL_TABLET | Freq: Once | ORAL | Status: AC
  Administered 2023-09-12: 5 mg via ORAL
  Filled 2023-09-12: qty 1

## 2023-09-12 MED ORDER — OXYCODONE HCL 5 MG PO TABS
5.0000 mg | ORAL_TABLET | Freq: Once | ORAL | Status: AC
  Administered 2023-09-12: 5 mg via ORAL
  Filled 2023-09-12: qty 1

## 2023-09-12 MED ORDER — KETOROLAC TROMETHAMINE 15 MG/ML IJ SOLN
15.0000 mg | Freq: Once | INTRAMUSCULAR | Status: AC
  Administered 2023-09-12: 15 mg via INTRAMUSCULAR
  Filled 2023-09-12: qty 1

## 2023-09-12 MED ORDER — ACETAMINOPHEN 500 MG PO TABS
1000.0000 mg | ORAL_TABLET | Freq: Once | ORAL | Status: DC
  Filled 2023-09-12: qty 2

## 2023-09-12 NOTE — ED Provider Notes (Signed)
[facility] Provider Note   CSN: [id number] Arrival date & time: 09/12/23  1847     History {Add pertinent medical, surgical, social history, OB history to HPI:1} Chief Complaint  Patient presents with   Abdominal Pain    Samantha Bates is a 42 y.o. female.  42 yo F with a chief complaint of right flank pain.  Has been bothering her little bit since yesterday.  Got quite a bit worse about 20 minutes ago and so came to the ED for evaluation.  Denies trauma to the area.  Worse with twisting turning palpation.  Feels like it radiates from her right side of her thoracic back around to the front.  Sometimes feels like it goes into the anterior aspect of the right leg.  Denies fevers denies vomiting.  Denies loss of bowel or bladder denies loss of rectal sensation denies numbness or weakness to her legs.   Abdominal Pain      Home Medications Prior to Admission medications   Medication Sig Start Date End Date Taking? Authorizing Provider  ibuprofen (ADVIL,MOTRIN) 800 MG tablet Take 1 tablet (800 mg total) by mouth 3 (three) times daily. Patient not taking: Reported on 01/14/2018 12/18/14   Gilda Crease, MD      Allergies    Patient has no known allergies.    Review of Systems   Review of Systems  Gastrointestinal:  Positive for abdominal pain.    Physical Exam Updated Vital Signs BP 104/62   Pulse 61   Temp 97.9 F (36.6 C) (Oral)   Resp 18   Ht 5\' 2"  (1.575 m)   Wt 70.3 kg   SpO2 98%   BMI 28.35 kg/m  Physical Exam Vitals and nursing note reviewed.  Constitutional:      General: She is not in acute distress.    Appearance: She is well-developed. She is not diaphoretic.  HENT:     Head: Normocephalic and atraumatic.  Eyes:     Pupils: Pupils are equal, round, and reactive to light.  Cardiovascular:     Rate and Rhythm: Normal rate and regular rhythm.     Heart sounds: No murmur heard.    No friction rub.  No gallop.  Pulmonary:     Effort: Pulmonary effort is normal.     Breath sounds: No wheezing or rales.  Abdominal:     General: There is no distension.     Palpations: Abdomen is soft.     Tenderness: There is no abdominal tenderness.  Musculoskeletal:        General: No tenderness.     Cervical back: Normal range of motion and neck supple.     Comments: Pulse motor and sensation intact to the lower extremities bilaterally.  Negative straight leg raise test bilaterally.  Skin:    General: Skin is warm and dry.  Neurological:     Mental Status: She is alert and oriented to person, place, and time.     GCS: GCS eye subscore is 4. GCS verbal subscore is 5. GCS motor subscore is 6.  Psychiatric:        Behavior: Behavior normal.     ED Results / Procedures / Treatments   Labs (all labs ordered are listed, but only abnormal results are displayed) Labs Reviewed  BASIC METABOLIC PANEL - Abnormal; Notable for the following components:      Result Value   Glucose, Bld 102 (*)    All  other components within normal limits  URINALYSIS, ROUTINE W REFLEX MICROSCOPIC  PREGNANCY, URINE  CBC  HEPATIC FUNCTION PANEL  LIPASE, BLOOD    EKG None  Radiology No results found.  Procedures Procedures  {Document cardiac monitor, telemetry assessment procedure when appropriate:1}  Medications Ordered in ED Medications  acetaminophen (TYLENOL) tablet 1,000 mg (has no administration in time range)  ketorolac (TORADOL) 15 MG/ML injection 15 mg (has no administration in time range)  oxyCODONE (Oxy IR/ROXICODONE) immediate release tablet 5 mg (has no administration in time range)  diazepam (VALIUM) tablet 5 mg (has no administration in time range)    ED Course/ Medical Decision Making/ A&P   {   Click here for ABCD2, HEART and other calculatorsREFRESH Note before signing :1}                              Medical Decision Making Amount and/or Complexity of Data Reviewed Labs:  ordered. Radiology: ordered.  Risk OTC drugs. Prescription drug management.   42 yo F with a chief complaints of right-sided thoracic back pain.  Most likely this is musculoskeletal by history and physical.  She has a fairly benign exam.  She does have some pain with trying to sit up on exam.  UA is negative for infection or hematuria.  Pregnancy test negative.  No significant electrolyte abnormalities LFTs and lipase are unremarkable.  No leukocytosis no anemia.  Will obtain a chest x-ray.    {Document critical care time when appropriate:1} {Document review of labs and clinical decision tools ie heart score, Chads2Vasc2 etc:1}  {Document your independent review of radiology images, and any outside records:1} {Document your discussion with family members, caretakers, and with consultants:1} {Document social determinants of health affecting pt's care:1} {Document your decision making why or why not admission, treatments were needed:1} Final Clinical Impression(s) / ED Diagnoses Final diagnoses:  None    Rx / DC Orders ED Discharge Orders     None

## 2023-09-12 NOTE — ED Triage Notes (Signed)
Pt c/o sharp pain on right side that starts at ribs and radiates down to groin area that started about an hour ago. Pt denies N/V/D

## 2023-09-13 NOTE — Discharge Instructions (Signed)
